# Patient Record
Sex: Female | Born: 1990 | Race: Black or African American | Hispanic: No | Marital: Married | Smoking: Never smoker
Health system: Southern US, Community
[De-identification: ages and names within clinical notes are randomized; demographics above are authoritative.]

## PROBLEM LIST (undated history)

## (undated) DIAGNOSIS — Z789 Other specified health status: Secondary | ICD-10-CM

## (undated) DIAGNOSIS — O09299 Supervision of pregnancy with other poor reproductive or obstetric history, unspecified trimester: Secondary | ICD-10-CM

## (undated) HISTORY — PX: NO PAST SURGERIES: SHX2092

## (undated) HISTORY — DX: Other specified health status: Z78.9

## (undated) HISTORY — DX: Supervision of pregnancy with other poor reproductive or obstetric history, unspecified trimester: O09.299

---

## 2012-09-19 ENCOUNTER — Other Ambulatory Visit: Payer: Self-pay | Admitting: Infectious Diseases

## 2012-09-19 ENCOUNTER — Ambulatory Visit
Admission: RE | Admit: 2012-09-19 | Discharge: 2012-09-19 | Disposition: A | Discharge status: Home / Self Care | Payer: No Typology Code available for payment source | Source: Ambulatory Visit | Attending: Infectious Diseases | Admitting: Infectious Diseases

## 2012-09-19 DIAGNOSIS — R7612 Nonspecific reaction to cell mediated immunity measurement of gamma interferon antigen response without active tuberculosis: Secondary | ICD-10-CM

## 2016-12-20 NOTE — L&D Delivery Note (Signed)
Patient is a 26 y.o. now G2P1011 s/p VAVD at 4329w3d, who was admitted on 12/02/17 for IOL for pre-eclampsia. S/p  IOL with cytotec, FB, and IV Pitocin. SROM at 06:56 am today. Had fever of 100.58F while pushing, but repeat temps < 100.60F and patient did not receive IV antibiotics. GBS neg. First stage of labor complete at 13:37.  Delivery Note At 4:57 PM a viable female was delivered via Vaginal, Vacuum (Extractor) (Presentation: vertex; LOA).  APGAR: 5, 9; weight 9lb 13.1oz Placenta status: intact.  Cord:  3-vessel  Cord pH: 7.35  Anesthesia:  Epidural Episiotomy: None Lacerations: 2nd degree;Perineal Suture Repair: 3.0 vicryl Est. Blood Loss (mL):  500  Patient complete and pushing with good maternal effort for almost 3 hours. Fetal station +3. Patient verbally consented for vacuum-assisted delivery.  The soft vacuum soft cup was positioned over the sagittal suture 3 cm anterior to posterior fontanelle.  Pressure was then increased to 500 mmHg, and the patient was instructed to push.  Pulling was administered along the pelvic curve.  4 pulls were administered during over two contractions, no popoffs.  The infant was then delivered atraumatically, noted to be a viable female infant, cord was clamped and cut and infant taken to the warmer. Cord pH obtained. Placenta delivered spontaneously with gentle cord traction. Fundus firm with massage and Pitocin. Perineum inspected and found to have a deep 2nd degree laceration, which was repaired with 3.0 Vycril with good hemostasis achieved.  Mom to postpartum.  Baby to Couplet care / Skin to Skin.   Raynelle FanningJulie P. Joshuajames Moehring, MD OB Fellow 12/03/17, 5:48 PM

## 2016-12-20 NOTE — L&D Delivery Note (Signed)
Infant heart rate after delivery 100 bpm and decreasing.  Infant taken to warmer and 30 seconds of PPV given.  Infant began to have spontaneous respiration and movement.  NICU arrived appoximately 1.5 minutes after delivery

## 2017-01-01 ENCOUNTER — Emergency Department (HOSPITAL_COMMUNITY)
Admission: EM | Admit: 2017-01-01 | Discharge: 2017-01-01 | Disposition: A | Discharge status: Home / Self Care | Payer: BLUE CROSS/BLUE SHIELD | Attending: Emergency Medicine | Admitting: Emergency Medicine

## 2017-01-01 ENCOUNTER — Encounter (HOSPITAL_COMMUNITY): Payer: Self-pay | Admitting: Emergency Medicine

## 2017-01-01 DIAGNOSIS — N939 Abnormal uterine and vaginal bleeding, unspecified: Secondary | ICD-10-CM

## 2017-01-01 DIAGNOSIS — Z3A08 8 weeks gestation of pregnancy: Secondary | ICD-10-CM | POA: Insufficient documentation

## 2017-01-01 DIAGNOSIS — O0281 Inappropriate change in quantitative human chorionic gonadotropin (hCG) in early pregnancy: Secondary | ICD-10-CM | POA: Diagnosis not present

## 2017-01-01 DIAGNOSIS — Z3491 Encounter for supervision of normal pregnancy, unspecified, first trimester: Secondary | ICD-10-CM

## 2017-01-01 DIAGNOSIS — O034 Incomplete spontaneous abortion without complication: Secondary | ICD-10-CM

## 2017-01-01 DIAGNOSIS — O039 Complete or unspecified spontaneous abortion without complication: Secondary | ICD-10-CM | POA: Diagnosis not present

## 2017-01-01 DIAGNOSIS — O209 Hemorrhage in early pregnancy, unspecified: Secondary | ICD-10-CM | POA: Diagnosis present

## 2017-01-01 DIAGNOSIS — R109 Unspecified abdominal pain: Secondary | ICD-10-CM

## 2017-01-01 LAB — CBC WITH DIFFERENTIAL/PLATELET
BASOS PCT: 0 %
Basophils Absolute: 0 10*3/uL (ref 0.0–0.1)
EOS ABS: 0 10*3/uL (ref 0.0–0.7)
Eosinophils Relative: 0 %
HCT: 40.1 % (ref 36.0–46.0)
HEMOGLOBIN: 13.4 g/dL (ref 12.0–15.0)
LYMPHS ABS: 2.5 10*3/uL (ref 0.7–4.0)
Lymphocytes Relative: 23 %
MCH: 29.8 pg (ref 26.0–34.0)
MCHC: 33.4 g/dL (ref 30.0–36.0)
MCV: 89.1 fL (ref 78.0–100.0)
Monocytes Absolute: 0.6 10*3/uL (ref 0.1–1.0)
Monocytes Relative: 6 %
NEUTROS PCT: 71 %
Neutro Abs: 7.7 10*3/uL (ref 1.7–7.7)
Platelets: 225 10*3/uL (ref 150–400)
RBC: 4.5 MIL/uL (ref 3.87–5.11)
RDW: 14 % (ref 11.5–15.5)
WBC: 10.8 10*3/uL — ABNORMAL HIGH (ref 4.0–10.5)

## 2017-01-01 LAB — ABO/RH: ABO/RH(D): A POS

## 2017-01-01 LAB — HCG, QUANTITATIVE, PREGNANCY: HCG, BETA CHAIN, QUANT, S: 5664 m[IU]/mL — AB (ref <5)

## 2017-01-01 NOTE — ED Triage Notes (Signed)
Patient with vaginal bleeding that started on Thursday.  Patient states she is [redacted] weeks pregnant and the bleeding got heavy today, with clots.  She is having abdominal cramping.

## 2017-01-01 NOTE — ED Provider Notes (Signed)
[facility]-EMERGENCY DEPT Provider Note  CSN: [id number] Arrival date & time: 01/01/17  1903  History   Chief Complaint Chief Complaint  Patient presents with  . Vaginal Bleeding   HPI Kathleen Cain is a 26 y.o. female.  The history is provided by the patient. No language interpreter was used.  Illness  This is a new problem. The current episode started more than 2 days ago. The problem occurs constantly. The problem has been gradually worsening. Pertinent negatives include no chest pain, no abdominal pain, no headaches and no shortness of breath. Nothing aggravates the symptoms. Nothing relieves the symptoms. She has tried nothing for the symptoms.    History reviewed. No pertinent past medical history.  There are no active problems to display for this patient.  History reviewed. No pertinent surgical history.  OB History    Gravida Para Term Preterm AB Living   1             SAB TAB Ectopic Multiple Live Births                  Home Medications    Prior to Admission medications   Medication Sig Start Date End Date Taking? Authorizing Provider  Prenatal Vit-Fe Fumarate-FA (PRENATAL MULTIVITAMIN) TABS tablet Take 1 tablet by mouth daily at 12 noon.   Yes Historical Provider, MD   Family History No family history on file.  Social History Social History  Substance Use Topics  . Smoking status: Never Smoker  . Smokeless tobacco: Never Used  . Alcohol use No    Allergies   Patient has no known allergies.   Review of Systems Review of Systems  Constitutional: Negative for chills and fever.  Respiratory: Negative for shortness of breath.   Cardiovascular: Negative for chest pain.  Gastrointestinal: Negative for abdominal pain.  Genitourinary: Positive for vaginal bleeding. Negative for dysuria, frequency and vaginal discharge.  Neurological: Negative for headaches.  All other systems reviewed and are negative.   Physical Exam Updated Vital Signs BP 113/81 (BP  Location: Right Arm)   Pulse 85   Temp 98.6 F (37 C) (Oral)   Resp 22   LMP 10/23/2016 (Exact Date)   SpO2 99%   Physical Exam  Constitutional: She is oriented to person, place, and time. No distress.  Young AA female  HENT:  Head: Normocephalic and atraumatic.  Eyes: EOM are normal. Pupils are equal, round, and reactive to light.  Neck: Normal range of motion. Neck supple.  Cardiovascular: Normal rate, regular rhythm and normal heart sounds.   Pulmonary/Chest: Effort normal and breath sounds normal.  Abdominal: Soft. Bowel sounds are normal. She exhibits no distension. There is tenderness (suprapubic). There is no rebound and no guarding.  Genitourinary:  Genitourinary Comments: Pelvic exam showing dark blood & clots in the vaginal vault, no BRB, os slightly open with dark blood & clots extruding, no CMT or cervical friability  Musculoskeletal: Normal range of motion.  Neurological: She is alert and oriented to person, place, and time.  Skin: Skin is warm and dry. Capillary refill takes less than 2 seconds. She is not diaphoretic.  Nursing note and vitals reviewed.   ED Treatments / Results  Labs (all labs ordered are listed, but only abnormal results are displayed) Labs Reviewed  CBC WITH DIFFERENTIAL/PLATELET - Abnormal; Notable for the following:       Result Value   WBC 10.8 (*)    All other components within normal limits  HCG, QUANTITATIVE, PREGNANCY -  Abnormal; Notable for the following:    hCG, Beta Chain, Quant, S 5,664 (*)    All other components within normal limits  ABO/RH   EKG  EKG Interpretation None      Radiology No results found.  Procedures Procedures (including critical care time)  Medications Ordered in ED Medications - No data to display  Initial Impression / Assessment and Plan / ED Course  I have reviewed the triage vital signs and the nursing notes.  Clinical Course   26 y.o. female with above stated PMHx, HPI, and physical. LMP x2  months ago. Seen at OSH x1 week ago with US estimated 8 weeks pregnancy w/o heartbeat. Vaginal spotting starting Thursday (x2 days ago). Now with heavier bleeding with passage of clots. Patient denies fever, chills, nausea, vomiting, diarrhea, urinary symptoms, shortness of breath, lightheadedness, fatigue.  ABO with A(+) blood type - no need for Rhogam. Beta hcg 5,664 (low for expected level of 8-9 weeks of pregnancy). CBC with Hgb of 13. Bedside transabdominal US showing no evidence of yolk sac or fetal pole no heartbeat. History & exam consistent with inevitable spontaneous miscarriage. [facility]  Laboratory results were personally reviewed by myself and used in the medical decision making of this patient's treatment and disposition.  Pt discharged home in stable condition. Strict ED return precautions dicussed including but not limited to fever, chills, worsening abdominal pain, SOB, or lightheadedness/syncope. Pt understands and agrees with the plan and has no further questions or concerns.   Pt care discussed with and followed by my attending, Dr. Asencion PartridgeJoshua Long  Garlen Reinig, MD Pager [phone]   Final Clinical Impressions(s) / ED Diagnoses   Final diagnoses:  First trimester pregnancy  Vaginal bleeding  Abdominal cramping  Inevitable spontaneous abortion    New Prescriptions Discharge Medication List as of 01/01/2017  9:24 PM       Angelina Okyan Regan Mcbryar, MD 01/01/17 [id number]    Maia PlanJoshua G Long, MD 01/02/17 1156

## 2017-01-01 NOTE — Discharge Instructions (Signed)
Please return for shortness of breath, lightheadedness, fever, or chills

## 2017-01-17 ENCOUNTER — Ambulatory Visit (INDEPENDENT_AMBULATORY_CARE_PROVIDER_SITE_OTHER): Payer: BLUE CROSS/BLUE SHIELD | Admitting: Obstetrics and Gynecology

## 2017-01-17 ENCOUNTER — Encounter: Payer: Self-pay | Admitting: Obstetrics and Gynecology

## 2017-01-17 VITALS — BP 103/69 | HR 82 | Ht 64.0 in | Wt 135.5 lb

## 2017-01-17 DIAGNOSIS — O039 Complete or unspecified spontaneous abortion without complication: Secondary | ICD-10-CM

## 2017-01-17 NOTE — Progress Notes (Signed)
   Subjective:    Patient ID: Kathleen Cain, female    DOB: 11-10-1991, 26 y.o.   MRN: [id number]  HPI Kathleen PionLaynaddis Sickles is a 26 y.o. G1P0 with LMP 10/23/16 here for follow up of probable SAB. [facility] and brings US report: no YS, fetal pole or FH. Also brings photo of scan showing questionable early IUP. She was advised to go to ER but did not go until 01/01/17 after 2 days of heavy bleeding with clots and cramping. Bedside US at that time showed no IUP and quantitative bHCG was 5664. After that she had 2 more days of bleeding with light vaginal spotting intermittently since then. No cramping.  Pregnancy was planned. Blood type A pos.     Review of Systems  Constitutional: Negative for fever.  Gastrointestinal: Negative for abdominal pain, diarrhea, nausea and vomiting.  Genitourinary: Positive for vaginal bleeding. Negative for dyspareunia, dysuria, flank pain, vaginal discharge and vaginal pain.       Objective:   Physical Exam  Constitutional: She is oriented to person, place, and time. She appears well-developed and well-nourished. No distress.  HENT:  Head: Normocephalic.  Eyes: Pupils are equal, round, and reactive to light.  Neck: Normal range of motion.  Cardiovascular: Normal rate.   Pulmonary/Chest: Effort normal.  Abdominal: Soft. There is tenderness.  Genitourinary: Vagina normal and uterus normal. Rectal exam shows guaiac negative stool.  Genitourinary Comments: Bimanual: cx thick, closed, no CMT, Uterus AV,  NSSP, no adnexal tenderness or masses  Musculoskeletal: Normal range of motion.  Neurological: She is alert and oriented to person, place, and time.  Skin: Skin is warm and dry.  Nursing note and vitals reviewed.         Assessment & Plan:  S/P SAB  Since no definite IUP documented, quantitative beta hCG sent. Will call tomorrow with result Advised barrier method contraception until normal menstrual cycle and PNV 1/d when  attempting pregnancy

## 2017-01-17 NOTE — Progress Notes (Signed)
Since Er Visit 01/01/17 had bright red bleeding for about 3 days. Since that occaionally has had dark spotting .

## 2017-01-18 ENCOUNTER — Telehealth: Payer: Self-pay | Admitting: Obstetrics and Gynecology

## 2017-01-18 DIAGNOSIS — O039 Complete or unspecified spontaneous abortion without complication: Secondary | ICD-10-CM | POA: Insufficient documentation

## 2017-01-18 LAB — HCG, QUANTITATIVE, PREGNANCY: hCG, Beta Chain, Quant, S: 10.2 m[IU]/mL — ABNORMAL HIGH

## 2017-01-18 NOTE — Telephone Encounter (Signed)
TC to pt: BHcg 10.2 Left message needs another F/U quant in 1 wk.

## 2017-01-24 ENCOUNTER — Other Ambulatory Visit: Payer: BLUE CROSS/BLUE SHIELD

## 2017-01-24 DIAGNOSIS — O039 Complete or unspecified spontaneous abortion without complication: Secondary | ICD-10-CM

## 2017-01-24 LAB — HCG, QUANTITATIVE, PREGNANCY: hCG, Beta Chain, Quant, S: 2 m[IU]/mL

## 2017-02-02 ENCOUNTER — Telehealth: Payer: Self-pay | Admitting: *Deleted

## 2017-02-02 NOTE — Telephone Encounter (Signed)
Pt left message requesting lab results from 2 weeks ago.

## 2017-02-09 NOTE — Telephone Encounter (Signed)
Mychart message to patient with results.  

## 2017-07-04 LAB — OB RESULTS CONSOLE ABO/RH: RH TYPE: POSITIVE

## 2017-07-04 LAB — OB RESULTS CONSOLE GC/CHLAMYDIA
CHLAMYDIA, DNA PROBE: NEGATIVE
GC PROBE AMP, GENITAL: NEGATIVE

## 2017-07-04 LAB — OB RESULTS CONSOLE RPR: RPR: NONREACTIVE

## 2017-07-04 LAB — OB RESULTS CONSOLE HIV ANTIBODY (ROUTINE TESTING): HIV: NONREACTIVE

## 2017-07-04 LAB — OB RESULTS CONSOLE HEPATITIS B SURFACE ANTIGEN: Hepatitis B Surface Ag: NEGATIVE

## 2017-07-04 LAB — OB RESULTS CONSOLE RUBELLA ANTIBODY, IGM: Rubella: IMMUNE

## 2017-07-04 LAB — OB RESULTS CONSOLE ANTIBODY SCREEN: ANTIBODY SCREEN: NEGATIVE

## 2017-11-01 LAB — OB RESULTS CONSOLE GC/CHLAMYDIA
CHLAMYDIA, DNA PROBE: NEGATIVE
Gonorrhea: NEGATIVE

## 2017-11-01 LAB — OB RESULTS CONSOLE GBS: STREP GROUP B AG: NEGATIVE

## 2017-11-30 ENCOUNTER — Encounter (HOSPITAL_COMMUNITY): Payer: Self-pay | Admitting: *Deleted

## 2017-11-30 ENCOUNTER — Telehealth (HOSPITAL_COMMUNITY): Payer: Self-pay | Admitting: *Deleted

## 2017-11-30 ENCOUNTER — Other Ambulatory Visit: Payer: Self-pay | Admitting: Obstetrics and Gynecology

## 2017-12-01 ENCOUNTER — Encounter (HOSPITAL_COMMUNITY): Payer: Self-pay | Admitting: *Deleted

## 2017-12-01 NOTE — Telephone Encounter (Signed)
Preadmission screen  

## 2017-12-02 ENCOUNTER — Encounter (HOSPITAL_COMMUNITY): Payer: Self-pay | Admitting: *Deleted

## 2017-12-02 ENCOUNTER — Inpatient Hospital Stay (HOSPITAL_COMMUNITY)
Admission: AD | Admit: 2017-12-02 | Discharge: 2017-12-05 | DRG: 807 | Disposition: A | Discharge status: Home / Self Care | Payer: BLUE CROSS/BLUE SHIELD | Source: Ambulatory Visit | Attending: Obstetrics and Gynecology | Admitting: Obstetrics and Gynecology

## 2017-12-02 DIAGNOSIS — Z3A40 40 weeks gestation of pregnancy: Secondary | ICD-10-CM | POA: Diagnosis not present

## 2017-12-02 DIAGNOSIS — O1494 Unspecified pre-eclampsia, complicating childbirth: Secondary | ICD-10-CM | POA: Diagnosis present

## 2017-12-02 DIAGNOSIS — O480 Post-term pregnancy: Secondary | ICD-10-CM | POA: Diagnosis present

## 2017-12-02 DIAGNOSIS — Z8759 Personal history of other complications of pregnancy, childbirth and the puerperium: Secondary | ICD-10-CM

## 2017-12-02 LAB — ABO/RH: ABO/RH(D): A POS

## 2017-12-02 LAB — CBC
HEMATOCRIT: 43.5 % (ref 36.0–46.0)
Hemoglobin: 14.4 g/dL (ref 12.0–15.0)
MCH: 32.2 pg (ref 26.0–34.0)
MCHC: 33.1 g/dL (ref 30.0–36.0)
MCV: 97.3 fL (ref 78.0–100.0)
Platelets: 166 10*3/uL (ref 150–400)
RBC: 4.47 MIL/uL (ref 3.87–5.11)
RDW: 14.3 % (ref 11.5–15.5)
WBC: 10.5 10*3/uL (ref 4.0–10.5)

## 2017-12-02 LAB — COMPREHENSIVE METABOLIC PANEL
ALT: 13 U/L — ABNORMAL LOW (ref 14–54)
AST: 29 U/L (ref 15–41)
Albumin: 3.3 g/dL — ABNORMAL LOW (ref 3.5–5.0)
Alkaline Phosphatase: 324 U/L — ABNORMAL HIGH (ref 38–126)
Anion gap: 12 (ref 5–15)
BUN: 7 mg/dL (ref 6–20)
CHLORIDE: 102 mmol/L (ref 101–111)
CO2: 19 mmol/L — ABNORMAL LOW (ref 22–32)
Calcium: 9.2 mg/dL (ref 8.9–10.3)
Creatinine, Ser: 0.41 mg/dL — ABNORMAL LOW (ref 0.44–1.00)
Glucose, Bld: 104 mg/dL — ABNORMAL HIGH (ref 65–99)
POTASSIUM: 4.1 mmol/L (ref 3.5–5.1)
Sodium: 133 mmol/L — ABNORMAL LOW (ref 135–145)
Total Bilirubin: 0.5 mg/dL (ref 0.3–1.2)
Total Protein: 7.2 g/dL (ref 6.5–8.1)

## 2017-12-02 LAB — TYPE AND SCREEN
ABO/RH(D): A POS
ANTIBODY SCREEN: NEGATIVE

## 2017-12-02 MED ORDER — FENTANYL CITRATE (PF) 100 MCG/2ML IJ SOLN: 100.0000 ug | INTRAMUSCULAR | Status: DC | PRN

## 2017-12-02 MED ORDER — OXYTOCIN BOLUS FROM INFUSION
500.0000 mL | Freq: Once | INTRAVENOUS | Status: AC
  Administered 2017-12-03: 500 mL via INTRAVENOUS

## 2017-12-02 MED ORDER — TERBUTALINE SULFATE 1 MG/ML IJ SOLN
0.2500 mg | Freq: Once | INTRAMUSCULAR | Status: DC | PRN
  Filled 2017-12-02: qty 1

## 2017-12-02 MED ORDER — ONDANSETRON HCL 4 MG/2ML IJ SOLN: 4.0000 mg | Freq: Four times a day (QID) | INTRAMUSCULAR | Status: DC | PRN

## 2017-12-02 MED ORDER — OXYCODONE-ACETAMINOPHEN 5-325 MG PO TABS: 2.0000 | ORAL_TABLET | ORAL | Status: DC | PRN

## 2017-12-02 MED ORDER — LIDOCAINE HCL (PF) 1 % IJ SOLN
30.0000 mL | INTRAMUSCULAR | Status: AC | PRN
  Administered 2017-12-03: 30 mL via SUBCUTANEOUS
  Filled 2017-12-02: qty 30

## 2017-12-02 MED ORDER — MISOPROSTOL 50MCG HALF TABLET
50.0000 ug | ORAL_TABLET | ORAL | Status: DC | PRN
  Administered 2017-12-02 – 2017-12-03 (×3): 50 ug via ORAL
  Filled 2017-12-02 (×4): qty 1

## 2017-12-02 MED ORDER — LACTATED RINGERS IV SOLN
INTRAVENOUS | Status: DC
  Administered 2017-12-02 – 2017-12-03 (×2): via INTRAVENOUS

## 2017-12-02 MED ORDER — OXYCODONE-ACETAMINOPHEN 5-325 MG PO TABS: 1.0000 | ORAL_TABLET | ORAL | Status: DC | PRN

## 2017-12-02 MED ORDER — LACTATED RINGERS IV SOLN
500.0000 mL | INTRAVENOUS | Status: DC | PRN
  Administered 2017-12-03: 500 mL via INTRAVENOUS

## 2017-12-02 MED ORDER — OXYTOCIN 40 UNITS IN LACTATED RINGERS INFUSION - SIMPLE MED
2.5000 [IU]/h | INTRAVENOUS | Status: DC
  Administered 2017-12-03: 2.5 [IU]/h via INTRAVENOUS

## 2017-12-02 MED ORDER — SOD CITRATE-CITRIC ACID 500-334 MG/5ML PO SOLN: 30.0000 mL | ORAL | Status: DC | PRN

## 2017-12-02 MED ORDER — ACETAMINOPHEN 325 MG PO TABS: 650.0000 mg | ORAL_TABLET | ORAL | Status: DC | PRN

## 2017-12-02 MED ORDER — FLEET ENEMA 7-19 GM/118ML RE ENEM: 1.0000 | ENEMA | RECTAL | Status: DC | PRN

## 2017-12-02 NOTE — Anesthesia Pain Management Evaluation Note (Signed)
  [facility] Pain Management Visit Note  Patient: Kathleen Cain, 26 y.o., female  "Hello I am a [facility] of the anesthesia team at [facility]. We have an anesthesia team available at all times to provide care throughout the hospital, including epidural management and anesthesia for C-section. I don't know your plan for the delivery whether it a natural birth, water birth, IV sedation, nitrous supplementation, doula or epidural, but we want to meet your pain goals."   1.Was your pain managed to your expectations on prior hospitalizations?   No prior hospitalizations  2.What is your expectation for pain management during this hospitalization?     Labor support without medications  3.How can we help you reach that goal? natural  Record the patient's initial score and the patient's pain goal.   Pain: 0  Pain Goal: 10 [facility] wants you to be able to say your pain was always managed very well.  Celine Dishman 12/02/2017

## 2017-12-02 NOTE — H&P (Signed)
LABOR AND DELIVERY ADMISSION HISTORY AND PHYSICAL NOTE  Kathleen Cain is a 26 y.o. female G2P0010 with IUP at 4785w2d by LMP presenting for IOL for pre-e (proteinuria) w/o severe features.  She reports positive fetal movement. She denies leakage of fluid or vaginal bleeding.  Prenatal History/Complications: PNC at HD Pregnancy complications:  - Pre-e  Past Medical History: Past Medical History:  Diagnosis Date  . Medical history non-contributory     Past Surgical History: Past Surgical History:  Procedure Laterality Date  . NO PAST SURGERIES      Obstetrical History: OB History    Gravida Para Term Preterm AB Living   2       1     SAB TAB Ectopic Multiple Live Births   1              Social History: Social History   Socioeconomic History  . Marital status: Single    Spouse name: None  . Number of children: None  . Years of education: None  . Highest education level: None  Social Needs  . Financial resource strain: None  . Food insecurity - worry: None  . Food insecurity - inability: None  . Transportation needs - medical: None  . Transportation needs - non-medical: None  Occupational History  . None  Tobacco Use  . Smoking status: Never Smoker  . Smokeless tobacco: Never Used  Substance and Sexual Activity  . Alcohol use: No  . Drug use: No  . Sexual activity: Yes    Birth control/protection: None  Other Topics Concern  . None  Social History Narrative  . None    Family History: Family History  Problem Relation Age of Onset  . Cervical cancer Mother     Allergies: No Known Allergies  Medications Prior to Admission  Medication Sig Dispense Refill Last Dose  . Prenatal Vit-Fe Fumarate-FA (PRENATAL MULTIVITAMIN) TABS tablet Take 1 tablet by mouth daily at 12 noon.   Not Taking     Review of Systems  All systems reviewed and negative except as stated in HPI  Physical Exam unknown if currently breastfeeding. General appearance: alert,  cooperative, appears stated age and no distress Lungs: no respiratory distress Heart: regular rate  Abdomen: soft, non-tender; bowel sounds normal Extremities: No calf swelling or tenderness Presentation: cephalic per clinic exam Fetal monitoring: external Uterine activity: toco   Prenatal labs: ABO, Rh: A/Positive/-- (07/16 0000) Antibody: Negative (07/16 0000) Rubella: Immune (07/16 0000) RPR: Nonreactive (07/16 0000)  HBsAg: Negative (07/16 0000)  HIV: Non-reactive (07/16 0000)  GC/Chlamydia: neg GBS: Negative (11/13 0000)  1 hr Glucola: normal Genetic screening:  Quad neg Anatomy US: normal  Prenatal Transfer Tool  Maternal Diabetes: No Genetic Screening: Normal Maternal Ultrasounds/Referrals: Normal Fetal Ultrasounds or other Referrals:  None Maternal Substance Abuse:  No Significant Maternal Medications:  None Significant Maternal Lab Results: None  No results found for this or any previous visit (from the past 24 hour(s)).  Patient Active Problem List   Diagnosis Date Noted  . Post-dates pregnancy 12/02/2017  . SAB (spontaneous abortion) 01/18/2017    Assessment: Kathleen Cain is a 26 y.o. G2P0010 at 5285w2d here for IOL 2/2 to pre-e (proteinuria) w/o severe features  #Labor: IOL planned cytotec #Pain: Per maternal request, does not currently want epidural #FWB: Cat 1 #ID:  gbs neg #MOF: breast #MOC:pills #Circ:  Yes, will provide list of local options  Marthenia RollingScott Bland 12/02/2017, 4:32 PM  I confirm that I have verified the  information documented in the resident's note and that I have also personally reperformed the physical exam and all medical decision making activities. The patient was seen and examined by me also Agree with note NST reactive and reassuring UCs as listed Cervical exams as listed in note  Aviva SignsWilliams, Reiner Loewen L, CNM

## 2017-12-02 NOTE — Progress Notes (Signed)
Labor Progress Note  Kathleen Cain is a 26 y.o. G2P0010 at 5857w2d  admitted for induction of labor due to Pre-eclamptic toxemia of pregnancy.  S: Doing well. Currently without pain. Wants to go natural in birthing process. Denies PIH symptoms (HA, scotomata, RUQ pain)  O:  BP 124/72   Pulse 95   Temp 98 F (36.7 C) (Oral)   Resp 16   Ht 5\' 4"  (1.626 m)   Wt 87.5 kg (193 lb)   BMI 33.13 kg/m   No intake/output data recorded.  FHT:  FHR: 150 bpm, variability: moderate,  accelerations:  Present,  decelerations:  Absent UC:   regular, every 4 minutes SVE:   Dilation: 1 Effacement (%): 50 Station: Ballotable Exam by:: Ferne CoeS Earl RNC Membranes intact  Labs: Lab Results  Component Value Date   WBC 10.5 12/02/2017   HGB 14.4 12/02/2017   HCT 43.5 12/02/2017   MCV 97.3 12/02/2017   PLT 166 12/02/2017    Assessment / Plan: 26 y.o. G2P0010 7657w2d. Not in labor Induction of labor due to preeclampsia  Labor: Cotninue induction with cytotec, FB placed Fetal Wellbeing:  Category I Pain Control:  Labor support without medications Anticipated MOD:  NSVD  Expectant management  Caryl AdaJazma Emmelia Holdsworth, DO OB Fellow

## 2017-12-03 ENCOUNTER — Inpatient Hospital Stay (HOSPITAL_COMMUNITY): Payer: BLUE CROSS/BLUE SHIELD | Admitting: Anesthesiology

## 2017-12-03 ENCOUNTER — Encounter (HOSPITAL_COMMUNITY): Payer: Self-pay | Admitting: *Deleted

## 2017-12-03 DIAGNOSIS — O1494 Unspecified pre-eclampsia, complicating childbirth: Secondary | ICD-10-CM

## 2017-12-03 DIAGNOSIS — Z3A40 40 weeks gestation of pregnancy: Secondary | ICD-10-CM

## 2017-12-03 LAB — PROTEIN / CREATININE RATIO, URINE: Creatinine, Urine: 32 mg/dL

## 2017-12-03 LAB — CBC
HCT: 38.9 % (ref 36.0–46.0)
HEMATOCRIT: 39.8 % (ref 36.0–46.0)
Hemoglobin: 13.1 g/dL (ref 12.0–15.0)
Hemoglobin: 13 g/dL (ref 12.0–15.0)
MCH: 32 pg (ref 26.0–34.0)
MCH: 32.3 pg (ref 26.0–34.0)
MCHC: 32.9 g/dL (ref 30.0–36.0)
MCHC: 33.4 g/dL (ref 30.0–36.0)
MCV: 97.1 fL (ref 78.0–100.0)
MCV: 96.5 fL (ref 78.0–100.0)
PLATELETS: 128 10*3/uL — AB (ref 150–400)
PLATELETS: 141 10*3/uL — AB (ref 150–400)
RBC: 4.1 MIL/uL (ref 3.87–5.11)
RBC: 4.03 MIL/uL (ref 3.87–5.11)
RDW: 14.3 % (ref 11.5–15.5)
RDW: 14.1 % (ref 11.5–15.5)
WBC: 12 10*3/uL — AB (ref 4.0–10.5)
WBC: 19.3 10*3/uL — AB (ref 4.0–10.5)

## 2017-12-03 LAB — RPR: RPR Ser Ql: NONREACTIVE

## 2017-12-03 MED ORDER — EPHEDRINE 5 MG/ML INJ
10.0000 mg | INTRAVENOUS | Status: DC | PRN
  Filled 2017-12-03: qty 2

## 2017-12-03 MED ORDER — DIBUCAINE 1 % RE OINT: 1.0000 "application " | TOPICAL_OINTMENT | RECTAL | Status: DC | PRN

## 2017-12-03 MED ORDER — BENZOCAINE-MENTHOL 20-0.5 % EX AERO
1.0000 "application " | INHALATION_SPRAY | CUTANEOUS | Status: DC | PRN
  Administered 2017-12-04: 1 via TOPICAL
  Filled 2017-12-03: qty 56

## 2017-12-03 MED ORDER — ONDANSETRON HCL 4 MG/2ML IJ SOLN: 4.0000 mg | INTRAMUSCULAR | Status: DC | PRN

## 2017-12-03 MED ORDER — PHENYLEPHRINE 40 MCG/ML (10ML) SYRINGE FOR IV PUSH (FOR BLOOD PRESSURE SUPPORT)
80.0000 ug | PREFILLED_SYRINGE | INTRAVENOUS | Status: DC | PRN
  Filled 2017-12-03: qty 10
  Filled 2017-12-03: qty 5

## 2017-12-03 MED ORDER — LACTATED RINGERS IV SOLN
500.0000 mL | Freq: Once | INTRAVENOUS | Status: AC
  Administered 2017-12-03: 500 mL via INTRAVENOUS

## 2017-12-03 MED ORDER — COCONUT OIL OIL
1.0000 "application " | TOPICAL_OIL | Status: DC | PRN
  Administered 2017-12-05: 1 via TOPICAL
  Filled 2017-12-03: qty 120

## 2017-12-03 MED ORDER — SENNOSIDES-DOCUSATE SODIUM 8.6-50 MG PO TABS
2.0000 | ORAL_TABLET | ORAL | Status: DC
  Administered 2017-12-04 (×2): 2 via ORAL
  Filled 2017-12-03 (×2): qty 2

## 2017-12-03 MED ORDER — ACETAMINOPHEN 325 MG PO TABS: 650.0000 mg | ORAL_TABLET | ORAL | Status: DC | PRN

## 2017-12-03 MED ORDER — IBUPROFEN 600 MG PO TABS
600.0000 mg | ORAL_TABLET | Freq: Four times a day (QID) | ORAL | Status: DC
  Administered 2017-12-04 – 2017-12-05 (×7): 600 mg via ORAL
  Filled 2017-12-03 (×7): qty 1

## 2017-12-03 MED ORDER — TETANUS-DIPHTH-ACELL PERTUSSIS 5-2.5-18.5 LF-MCG/0.5 IM SUSP: 0.5000 mL | Freq: Once | INTRAMUSCULAR | Status: DC

## 2017-12-03 MED ORDER — SIMETHICONE 80 MG PO CHEW: 80.0000 mg | CHEWABLE_TABLET | ORAL | Status: DC | PRN

## 2017-12-03 MED ORDER — LIDOCAINE HCL (PF) 1 % IJ SOLN
INTRAMUSCULAR | Status: DC | PRN
  Administered 2017-12-03: 13 mL via EPIDURAL

## 2017-12-03 MED ORDER — FENTANYL 2.5 MCG/ML BUPIVACAINE 1/10 % EPIDURAL INFUSION (WH - ANES)
14.0000 mL/h | INTRAMUSCULAR | Status: DC | PRN
  Administered 2017-12-03: 14 mL/h via EPIDURAL
  Filled 2017-12-03: qty 100

## 2017-12-03 MED ORDER — DIPHENHYDRAMINE HCL 50 MG/ML IJ SOLN: 12.5000 mg | INTRAMUSCULAR | Status: DC | PRN

## 2017-12-03 MED ORDER — DIPHENHYDRAMINE HCL 25 MG PO CAPS: 25.0000 mg | ORAL_CAPSULE | Freq: Four times a day (QID) | ORAL | Status: DC | PRN

## 2017-12-03 MED ORDER — WITCH HAZEL-GLYCERIN EX PADS: 1.0000 "application " | MEDICATED_PAD | CUTANEOUS | Status: DC | PRN

## 2017-12-03 MED ORDER — TERBUTALINE SULFATE 1 MG/ML IJ SOLN
0.2500 mg | Freq: Once | INTRAMUSCULAR | Status: DC | PRN
  Filled 2017-12-03: qty 1

## 2017-12-03 MED ORDER — ZOLPIDEM TARTRATE 5 MG PO TABS: 5.0000 mg | ORAL_TABLET | Freq: Every evening | ORAL | Status: DC | PRN

## 2017-12-03 MED ORDER — PRENATAL MULTIVITAMIN CH
1.0000 | ORAL_TABLET | Freq: Every day | ORAL | Status: DC
  Administered 2017-12-05: 1 via ORAL
  Filled 2017-12-03 (×2): qty 1

## 2017-12-03 MED ORDER — PHENYLEPHRINE 40 MCG/ML (10ML) SYRINGE FOR IV PUSH (FOR BLOOD PRESSURE SUPPORT)
80.0000 ug | PREFILLED_SYRINGE | INTRAVENOUS | Status: DC | PRN
Start: 2017-12-03 — End: 2017-12-03
  Filled 2017-12-03: qty 5

## 2017-12-03 MED ORDER — OXYTOCIN 40 UNITS IN LACTATED RINGERS INFUSION - SIMPLE MED
1.0000 m[IU]/min | INTRAVENOUS | Status: DC
  Administered 2017-12-03: 4 m[IU]/min via INTRAVENOUS
  Administered 2017-12-03: 2 m[IU]/min via INTRAVENOUS
  Filled 2017-12-03: qty 1000

## 2017-12-03 MED ORDER — ONDANSETRON HCL 4 MG PO TABS: 4.0000 mg | ORAL_TABLET | ORAL | Status: DC | PRN

## 2017-12-03 NOTE — Progress Notes (Signed)
POSTPARTUM PROGRESS NOTE  Post Partum Day 1 Subjective:  Kathleen Cain is a 26 y.o. G2P1011 4266w3d s/p VAVD.  No acute events overnight.  Pt denies problems with ambulating, voiding or po intake.  She denies nausea or vomiting.  Pain is well controlled. Lochia Minimal.   Objective: Blood pressure (!) 105/54, pulse 97, temperature 98.4 F (36.9 C), temperature source Oral, resp. rate 18, height 5\' 4"  (1.626 m), weight 180 lb 1.6 oz (81.7 kg), SpO2 100 %, unknown if currently breastfeeding.  Physical Exam:  General: alert, cooperative and no distress Lochia:normal flow Chest: no respiratory distress Heart:regular rate, distal pulses intact Abdomen: soft, nontender,  Uterine Fundus: firm, appropriately tender DVT Evaluation: No calf swelling or tenderness Extremities: no edema  Recent Labs    12/03/17 0829 12/03/17 1856  HGB 13.1 13.0  HCT 39.8 38.9    Assessment/Plan:  ASSESSMENT: Kathleen Cain is a 26 y.o. G2P1011 2966w3d s/p VAVD.  Plan for discharge tomorrow   LOS: 2 days   Oney Folz MossDO 12/04/2017, 6:51 AM

## 2017-12-03 NOTE — Progress Notes (Signed)
Vacuum applied

## 2017-12-03 NOTE — Progress Notes (Signed)
[employer] phone interpreter [phone] [facility] services for epidural preparation, education and placement for nursing staff and Dr. Hyacinth MeekerMiller [phone]).

## 2017-12-03 NOTE — Anesthesia Preprocedure Evaluation (Signed)

## 2017-12-03 NOTE — Progress Notes (Signed)
Patient doing well. Foley bulb still in place. Unable to give 3rd dose of cytotec due to contraction frequency. BPs wnl. Category 1 tracing.

## 2017-12-03 NOTE — Progress Notes (Signed)
Kathleen Cain is a 26 y.o. G2P0010 at 636w3d.  Subjective: Patient is feeling better on epidural and is progressing on pitocin  Objective: BP 116/66   Pulse 93   Temp 97.9 F (36.6 C) (Axillary)   Resp 20   Ht 5\' 4"  (1.626 m)   Wt 87.5 kg (193 lb)   SpO2 99%   BMI 33.13 kg/m    FHT:  FHR: 140 bpm, variability: appropriate,  accelerations:  15x15,  decelerations:  none UC:   Q 1-6 minutes, 30-90 Dilation: 7 Effacement (%): 80 Cervical Position: Middle Station: -2 Presentation: Vertex Exam by:: C Fisher RN  Labs: Results for orders placed or performed during the hospital encounter of 12/02/17 (from the past 24 hour(s))  CBC     Status: None   Collection Time: 12/02/17  4:33 PM  Result Value Ref Range   WBC 10.5 4.0 - 10.5 K/uL   RBC 4.47 3.87 - 5.11 MIL/uL   Hemoglobin 14.4 12.0 - 15.0 g/dL   HCT 16.143.5 09.636.0 - 04.546.0 %   MCV 97.3 78.0 - 100.0 fL   MCH 32.2 26.0 - 34.0 pg   MCHC 33.1 30.0 - 36.0 g/dL   RDW 40.914.3 81.111.5 - 91.415.5 %   Platelets 166 150 - 400 K/uL  RPR     Status: None   Collection Time: 12/02/17  4:33 PM  Result Value Ref Range   RPR Ser Ql Non Reactive Non Reactive  Type and screen     Status: None   Collection Time: 12/02/17  4:33 PM  Result Value Ref Range   ABO/RH(D) A POS    Antibody Screen NEG    Sample Expiration 12/05/2017   Comprehensive metabolic panel     Status: Abnormal   Collection Time: 12/02/17  4:33 PM  Result Value Ref Range   Sodium 133 (L) 135 - 145 mmol/L   Potassium 4.1 3.5 - 5.1 mmol/L   Chloride 102 101 - 111 mmol/L   CO2 19 (L) 22 - 32 mmol/L   Glucose, Bld 104 (H) 65 - 99 mg/dL   BUN 7 6 - 20 mg/dL   Creatinine, Ser 7.820.41 (L) 0.44 - 1.00 mg/dL   Calcium 9.2 8.9 - 95.610.3 mg/dL   Total Protein 7.2 6.5 - 8.1 g/dL   Albumin 3.3 (L) 3.5 - 5.0 g/dL   AST 29 15 - 41 U/L   ALT 13 (L) 14 - 54 U/L   Alkaline Phosphatase 324 (H) 38 - 126 U/L   Total Bilirubin 0.5 0.3 - 1.2 mg/dL   GFR calc non Af Amer >60 >60 mL/min   GFR calc Af  Amer >60 >60 mL/min   Anion gap 12 5 - 15  Protein / creatinine ratio, urine     Status: None   Collection Time: 12/03/17  5:56 AM  Result Value Ref Range   Creatinine, Urine 32.00 mg/dL   Total Protein, Urine <6 mg/dL   Protein Creatinine Ratio        0.00 - 0.15 mg/mg[Cre]  CBC     Status: Abnormal   Collection Time: 12/03/17  8:29 AM  Result Value Ref Range   WBC 12.0 (H) 4.0 - 10.5 K/uL   RBC 4.10 3.87 - 5.11 MIL/uL   Hemoglobin 13.1 12.0 - 15.0 g/dL   HCT 21.339.8 08.636.0 - 57.846.0 %   MCV 97.1 78.0 - 100.0 fL   MCH 32.0 26.0 - 34.0 pg   MCHC 32.9 30.0 - 36.0 g/dL   RDW  14.3 11.5 - 15.5 %   Platelets 128 (L) 150 - 400 K/uL    Assessment / Plan: 165w3d week IUP Labor: pitocin Fetal Wellbeing:  Category 1 Pain Control:  epidural Anticipated MOD:  svd  Marthenia RollingBland, Nicola Heinemann, DO 12/03/2017 11:52 AM

## 2017-12-03 NOTE — Anesthesia Procedure Notes (Signed)
Epidural Patient location during procedure: OB Start time: 12/03/2017 9:50 AM End time: 12/03/2017 10:04 AM  Staffing Anesthesiologist: Lowella CurbMiller, Carnelia Oscar Ray, MD Performed: anesthesiologist   Preanesthetic Checklist Completed: patient identified, site marked, surgical consent, pre-op evaluation, timeout performed, IV checked, risks and benefits discussed and monitors and equipment checked  Epidural Patient position: sitting Prep: ChloraPrep Patient monitoring: heart rate, cardiac monitor, continuous pulse ox and blood pressure Approach: midline Location: L2-L3 Injection technique: LOR saline  Needle:  Needle type: Tuohy  Needle gauge: 17 G Needle length: 9 cm Needle insertion depth: 5 cm Catheter type: closed end flexible Catheter size: 20 Guage Catheter at skin depth: 9 cm Test dose: negative  Assessment Events: blood not aspirated, injection not painful, no injection resistance, negative IV test and no paresthesia  Additional Notes Reason for block:procedure for pain

## 2017-12-03 NOTE — Progress Notes (Signed)
Labor Progress Note  Kathleen Cain is a 26 y.o. G2P0010 at 7574w3d  admitted for induction of labor due to Pre-eclamptic toxemia of pregnancy.  S: Starting to feel contractions more. Denies PIH symptoms (HA, scotomata, RUQ pain).   O:  BP 111/60   Pulse 87   Temp 98.6 F (37 C) (Oral)   Resp (!) 148   Ht 5\' 4"  (1.626 m)   Wt 87.5 kg (193 lb)   BMI 33.13 kg/m   No intake/output data recorded.  FHT:  FHR: 135 bpm, variability: moderate,  accelerations:  Present,  decelerations:  Absent UC:   regular, every 2-5 minutes SVE:   5/70/-2   Labs: Lab Results  Component Value Date   WBC 10.5 12/02/2017   HGB 14.4 12/02/2017   HCT 43.5 12/02/2017   MCV 97.3 12/02/2017   PLT 166 12/02/2017    Assessment / Plan: 26 y.o. G2P0010 5474w3d in early labor Induction of labor due to preeclampsia  Labor: Progressing normally, s/p induction with Cytotec and FB. Will start pitocin when able. Fetal Wellbeing:  Category I Pain Control:  Labor support without medications Anticipated MOD:  NSVD  Expectant management   Caryl AdaJazma Mariaisabel Bodiford, DO OB Fellow

## 2017-12-03 NOTE — Progress Notes (Signed)
99.1 axillary

## 2017-12-04 NOTE — Lactation Note (Signed)
This note was copied from a baby's chart. Lactation Consultation Note Noted baby retracting w/increased respirations. Notified RN to come to rm. RN into assess. Patient Name: Kathleen Cain ZHGDJ'MToday's Date: 12/04/2017     Maternal Data    Feeding Feeding Type: Breast Fed Length of feed: 0 min  LATCH Score                   Interventions    Lactation Tools Discussed/Used     Consult Status      Olivier Frayre G 12/04/2017, 3:51 AM

## 2017-12-04 NOTE — Anesthesia Postprocedure Evaluation (Signed)
Anesthesia Post Note  Patient: Reighan Ocanas  Procedure(s) Performed: AN AD HOC LABOR EPIDURAL     Patient location during evaluation: Mother Baby Anesthesia Type: Epidural Level of consciousness: awake and alert Pain management: pain level controlled Vital Signs Assessment: post-procedure vital signs reviewed and stable Respiratory status: spontaneous breathing, nonlabored ventilation and respiratory function stable Cardiovascular status: stable Postop Assessment: no headache, no backache, epidural receding and patient able to bend at knees Anesthetic complications: no    Last Vitals:  Vitals:   12/04/17 0100 12/04/17 0607  BP: 111/63 (!) 105/54  Pulse: 93 97  Resp: 18 18  Temp: 37.4 C 36.9 C  SpO2: 98% 100%    Last Pain:  Vitals:   12/04/17 0609  TempSrc:   PainSc: 0-No pain   Pain Goal:                 Rica RecordsICKELTON,Thayer Embleton

## 2017-12-04 NOTE — Lactation Note (Signed)
This note was copied from a baby's chart. Lactation Consultation Note Baby has increased respirations. Resp. 76. Flaring occasionally, irregular respirations. Reported to RN. Rn monitored O2 sats.  Hand expression taught. No colostrum noted from very short shaft nipples. shells given to wear in bra. MGM assisting in appyling bra on. Hand pump given.  Mom encouraged to feed baby 8-12 times/24 hours and with feeding cues. If baby hasn't waken 3 hours, wake baby for feeding.  Newborn behavior, feeding habits, STS, I&O, cluster feeding, supply and demand explained.  Encouraged to call for assistance or questions.  WH/LC brochure given w/resources, support groups and LC services.  Patient Name: Kathleen Cain AVWUJ'WToday's Date: 12/04/2017 Reason for consult: Initial assessment   Maternal Data Has patient been taught Hand Expression?: Yes Does the patient have breastfeeding experience prior to this delivery?: No  Feeding    LATCH Score Latch: Too sleepy or reluctant, no latch achieved, no sucking elicited.  Audible Swallowing: None  Type of Nipple: Everted at rest and after stimulation(short shaft)  Comfort (Breast/Nipple): Soft / non-tender        Interventions Interventions: Breast feeding basics reviewed;Breast compression;Hand pump;Support pillows;Breast massage;Position options;Hand express;Shells;Reverse pressure;Pre-pump if needed  Lactation Tools Discussed/Used Tools: Shells;Pump Shell Type: Inverted Breast pump type: Manual WIC Program: Yes Pump Review: Setup, frequency, and cleaning;Milk Storage Initiated by:: Peri JeffersonL. Asyah Candler RN IBCLC Date initiated:: 12/04/17   Consult Status Consult Status: Follow-up Date: 12/04/17 Follow-up type: In-patient    Kathleen DancerCARVER, [name] 12/04/2017, 5:23 AM

## 2017-12-05 ENCOUNTER — Ambulatory Visit: Payer: Self-pay

## 2017-12-05 MED ORDER — IBUPROFEN 600 MG PO TABS: 600.0000 mg | ORAL_TABLET | Freq: Four times a day (QID) | ORAL | 0 refills | Status: DC

## 2017-12-05 NOTE — Lactation Note (Signed)
This note was copied from a baby's chart. Lactation Consultation Note  Patient Name: Kathleen Bunnie PionLaynaddis Reining WUJWJ'XToday's Date: 12/05/2017 Reason for consult: Follow-up assessment   Follow up with parents of 3746 hour old infant. Infant with 9 BF for 10-60 minutes, formula x 3 of 14-30 cc, 3 voids and 3 stools in the last 24 hours. Infant weight 9 lb 5.2 oz with 5% weight loss since birth.   Mom reports she only has colostrum and is offering formula to supplement infant. Discussed supply and demand and milk coming to volume. Enc mom to offer breast before offering formula. Enc mom to pump and hand express post BF to stimulate supply and to offer to infant post BF. Mom has a manual pump and an electric pump at home for use. Mom has pumped once last night and not again. She did not give infant the few gtts that were pumped. Enc mom to use EBM at next feeding.   Reviewed I/O, Signs of dehydration in the infant, Engorgement prevention/treatment and breast milk expression and storage. Mom reports nipple soreness with initial latch that improves with feeding. Mom denies breasts feeling fuller today.   Inf[facility] with follow up [facility] att in 2 days [facility].   Reviewed LC Brochure, mom informed of OP services, BF Support GRoups and LC phone #. Enc mom to call with any questions/concerns as needed.    Maternal Data Has patient been taught Hand Expression?: Yes Does the patient have breastfeeding experience prior to this delivery?: No  Feeding Feeding Type: Bottle Fed - Formula Length of feed: 60 min  LATCH Score Latch: Grasps breast easily, tongue down, lips flanged, rhythmical sucking.  Audible Swallowing: A few with stimulation  Type of Nipple: Everted at rest and after stimulation  Comfort (Breast/Nipple): Soft / non-tender  Hold (Positioning): Assistance needed to correctly position infant at breast and maintain latch.  LATCH Score: 8  Interventions Interventions: Breast  feeding basics reviewed;Support pillows;Hand express  Lactation Tools Discussed/Used WIC Program: Yes   Consult Status Consult Status: Complete Follow-up type: Call as needed    Ed BlalockSharon S Kenzington Mielke 12/05/2017, 3:27 PM

## 2017-12-05 NOTE — Discharge Instructions (Signed)
Postpartum Care After Vaginal Delivery °The period of time right after you deliver your newborn is called the postpartum period. °What kind of medical care will I receive? °· You may continue to receive fluids and medicines through an IV tube inserted into one of your veins. °· If an incision was made near your vagina (episiotomy) or if you had some vaginal tearing during delivery, cold compresses may be placed on your episiotomy or your tear. This helps to reduce pain and swelling. °· You may be given a squirt bottle to use when you go to the bathroom. You may use this until you are comfortable wiping as usual. To use the squirt bottle, follow these steps: °? Before you urinate, fill the squirt bottle with warm water. Do not use hot water. °? After you urinate, while you are sitting on the toilet, use the squirt bottle to rinse the area around your urethra and vaginal opening. This rinses away any urine and blood. °? You may do this instead of wiping. As you start healing, you may use the squirt bottle before wiping yourself. Make sure to wipe gently. °? Fill the squirt bottle with clean water every time you use the bathroom. °· You will be given sanitary pads to wear. °How can I expect to feel? °· You may not feel the need to urinate for several hours after delivery. °· You will have some soreness and pain in your abdomen and vagina. °· If you are breastfeeding, you may have uterine contractions every time you breastfeed for up to several weeks postpartum. Uterine contractions help your uterus return to its normal size. °· It is normal to have vaginal bleeding (lochia) after delivery. The amount and appearance of lochia is often similar to a menstrual period in the first week after delivery. It will gradually decrease over the next few weeks to a dry, yellow-brown discharge. For most women, lochia stops completely by 6-8 weeks after delivery. Vaginal bleeding can vary from woman to woman. °· Within the first few  days after delivery, you may have breast engorgement. This is when your breasts feel heavy, full, and uncomfortable. Your breasts may also throb and feel hard, tightly stretched, warm, and tender. After this occurs, you may have milk leaking from your breasts. Your health care provider can help you relieve discomfort due to breast engorgement. Breast engorgement should go away within a few days. °· You may feel more sad or worried than normal due to hormonal changes after delivery. These feelings should not last more than a few days. If these feelings do not go away after several days, speak with your health care provider. °How should I care for myself? °· Tell your health care provider if you have pain or discomfort. °· Drink enough water to keep your urine clear or pale yellow. °· Wash your hands thoroughly with soap and water for at least 20 seconds after changing your sanitary pads, after using the toilet, and before holding or feeding your baby. °· If you are not breastfeeding, avoid touching your breasts a lot. Doing this can make your breasts produce more milk. °· If you become weak or lightheaded, or you feel like you might faint, ask for help before: °? Getting out of bed. °? Showering. °· Change your sanitary pads frequently. Watch for any changes in your flow, such as a sudden increase in volume, a change in color, the passing of large blood clots. If you pass a blood clot from your vagina, save it   to show to your health care provider. Do not flush blood clots down the toilet without having your health care provider look at them. °· Make sure that all your vaccinations are up to date. This can help protect you and your baby from getting certain diseases. You may need to have immunizations done before you leave the hospital. °· If desired, talk with your health care provider about methods of family planning or birth control (contraception). °How can I start bonding with my baby? °Spending as much time as  possible with your baby is very important. During this time, you and your baby can get to know each other and develop a bond. Having your baby stay with you in your room (rooming in) can give you time to get to know your baby. Rooming in can also help you become comfortable caring for your baby. Breastfeeding can also help you bond with your baby. °How can I plan for returning home with my baby? °· Make sure that you have a car seat installed in your vehicle. °? Your car seat should be checked by a certified car seat installer to make sure that it is installed safely. °? Make sure that your baby fits into the car seat safely. °· Ask your health care provider any questions you have about caring for yourself or your baby. Make sure that you are able to contact your health care provider with any questions after leaving the hospital. °This information is not intended to replace advice given to you by your health care provider. Make sure you discuss any questions you have with your health care provider. °Document Released: 10/03/2007 Document Revised: 05/10/2016 Document Reviewed: 11/10/2015 °Elsevier Interactive Patient Education © 2018 Elsevier Inc. ° °

## 2017-12-05 NOTE — Discharge Summary (Signed)
OB Discharge Summary     Patient Name: Kathleen Cain DOB: 11-Jul-1991 MRN: [id number]  Date of admission: 12/02/2017 Delivering MD: Frederik PearEGELE, Mionna Advincula P   Date of discharge: 12/05/2017  Admitting diagnosis: 40.2WKS INDUCTION Intrauterine pregnancy: 6265w3d     Secondary diagnosis:  Principal Problem:   Status post vacuum-assisted vaginal delivery Active Problems:   Post-dates pregnancy  Additional problems: vacuum assisted vaginal delivery Pre-eclampsia     Discharge diagnosis: Term Pregnancy Delivered                                                                                                Post partum procedures:n/a  Augmentation: Pitocin  Complications: None  Hospital course:  Induction of Labor With Vaginal Delivery   26 y.o. yo G2P1011 at 3165w3d was admitted to the hospital 12/02/2017 for induction of labor.  Indication for induction: Preeclampsia.  Patient had an uncomplicated labor course as follows: Membrane Rupture Time/Date: 6:56 AM ,12/03/2017   Intrapartum Procedures: Episiotomy: None [1]                                         Lacerations:  2nd degree [3];Perineal [11]  Patient had delivery of a Viable infant.  Information for the patient's newborn:  Emmit AlexandersHumena, Boy Abrar [[id number]][[id number]]  Delivery Method: Vaginal, Vacuum (Extractor)(Filed from Delivery Summary)   12/03/2017  Details of delivery can be found in separate delivery note.  Patient had a routine postpartum course. Patient is discharged home 12/05/17.  Physical exam  Vitals:   12/04/17 0639 12/04/17 1825 12/05/17 0700 12/05/17 0731  BP:  116/74 (!) 102/55   Pulse:  86 91   Resp:  18 18   Temp:  98.1 F (36.7 C) 98.3 F (36.8 C)   TempSrc:  Oral Oral   SpO2:      Weight: 81.7 kg (180 lb 1.6 oz)   82.2 kg (181 lb 4.8 oz)  Height:       General: alert, cooperative and no distress Lochia: appropriate Uterine Fundus: firm Incision: N/A DVT Evaluation: No evidence of DVT seen on physical  exam. Labs: Lab Results  Component Value Date   WBC 19.3 (H) 12/03/2017   HGB 13.0 12/03/2017   HCT 38.9 12/03/2017   MCV 96.5 12/03/2017   PLT 141 (L) 12/03/2017   CMP Latest Ref Rng & Units 12/02/2017  Glucose 65 - 99 mg/dL 086(V104(H)  BUN 6 - 20 mg/dL 7  Creatinine 7.840.44 - 6.961.00 mg/dL 2.95(M0.41(L)  Sodium 841135 - 324145 mmol/L 133(L)  Potassium 3.5 - 5.1 mmol/L 4.1  Chloride 101 - 111 mmol/L 102  CO2 22 - 32 mmol/L 19(L)  Calcium 8.9 - 10.3 mg/dL 9.2  Total Protein 6.5 - 8.1 g/dL 7.2  Total Bilirubin 0.3 - 1.2 mg/dL 0.5  Alkaline Phos 38 - 126 U/L 324(H)  AST 15 - 41 U/L 29  ALT 14 - 54 U/L 13(L)    Discharge instruction: per After Visit Summary and "Baby and Me Booklet".  After visit meds:  Allergies as of 12/05/2017   No Known Allergies     Medication List    TAKE these medications   ibuprofen 600 MG tablet Commonly known as:  ADVIL,MOTRIN Take 1 tablet (600 mg total) by mouth every 6 (six) hours.   prenatal multivitamin Tabs tablet Take 1 tablet by mouth daily at 12 noon.       Diet: routine diet  Activity: Advance as tolerated. Pelvic rest for 6 weeks.   Outpatient follow up: 4 weeks at HD Postpartum contraception: Progesterone only pills  Newborn Data: Live born female  Birth Weight: 9 lb 13.1 oz (4455 g) APGAR: 5, 9  Newborn Delivery   Birth date/time:  12/03/2017 16:57:00 Delivery type:  Vaginal, Vacuum (Extractor)     Baby Feeding: Bottle Disposition:home with mother   12/05/2017 Marthenia RollingScott Bland, DO  OB FELLOW DISCHARGE ATTESTATION  I have seen and examined this patient and agree with above documentation in the resident's note.   Frederik PearJulie P Miko Markwood, MD OB Fellow

## 2017-12-06 DIAGNOSIS — Z8759 Personal history of other complications of pregnancy, childbirth and the puerperium: Secondary | ICD-10-CM

## 2017-12-07 ENCOUNTER — Inpatient Hospital Stay (HOSPITAL_COMMUNITY)
Admission: RE | Admit: 2017-12-07 | Discharge: 2017-12-07 | Disposition: A | Discharge status: Home / Self Care | Payer: BLUE CROSS/BLUE SHIELD | Source: Ambulatory Visit | Attending: Family Medicine | Admitting: Family Medicine

## 2019-12-21 NOTE — L&D Delivery Note (Signed)
Delivery Note At 6:48 PM a viable female was delivered via Vaginal, Spontaneous (Presentation: Left Occiput Anterior).  APGAR: 9, 9; weight pending.   Placenta status: Spontaneous, Intact.  Cord: 3 vessels with the following complications: None.   Anesthesia: Epidural Episiotomy: None Lacerations: 2nd degree;Perineal Suture Repair: 2.0 3.0 vicryl Est. Blood Loss (mL): 368  Mom to postpartum.  Baby to Couplet care / Skin to Skin.  Alric Seton 07/28/2020, 7:22 PM

## 2020-01-17 ENCOUNTER — Other Ambulatory Visit (HOSPITAL_COMMUNITY): Payer: Self-pay | Admitting: Nurse Practitioner

## 2020-01-17 DIAGNOSIS — Z3682 Encounter for antenatal screening for nuchal translucency: Secondary | ICD-10-CM

## 2020-01-17 DIAGNOSIS — Z3A12 12 weeks gestation of pregnancy: Secondary | ICD-10-CM

## 2020-01-17 LAB — OB RESULTS CONSOLE GC/CHLAMYDIA
Chlamydia: NEGATIVE
Gonorrhea: NEGATIVE

## 2020-01-17 LAB — HEPATITIS C ANTIBODY: HCV Ab: NEGATIVE

## 2020-01-17 LAB — OB RESULTS CONSOLE RPR: RPR: NONREACTIVE

## 2020-01-17 LAB — OB RESULTS CONSOLE HIV ANTIBODY (ROUTINE TESTING): HIV: NONREACTIVE

## 2020-01-17 LAB — OB RESULTS CONSOLE HEPATITIS B SURFACE ANTIGEN: Hepatitis B Surface Ag: NEGATIVE

## 2020-01-18 ENCOUNTER — Encounter (HOSPITAL_COMMUNITY): Payer: Self-pay | Admitting: *Deleted

## 2020-01-21 ENCOUNTER — Other Ambulatory Visit (HOSPITAL_COMMUNITY): Payer: Self-pay | Admitting: Family Medicine

## 2020-01-21 DIAGNOSIS — Z3682 Encounter for antenatal screening for nuchal translucency: Secondary | ICD-10-CM

## 2020-01-21 DIAGNOSIS — Z3A12 12 weeks gestation of pregnancy: Secondary | ICD-10-CM

## 2020-01-22 ENCOUNTER — Encounter (HOSPITAL_COMMUNITY): Payer: Self-pay

## 2020-01-22 ENCOUNTER — Ambulatory Visit (HOSPITAL_COMMUNITY): Payer: Medicaid Other | Admitting: *Deleted

## 2020-01-22 ENCOUNTER — Ambulatory Visit (HOSPITAL_COMMUNITY)
Admission: RE | Admit: 2020-01-22 | Discharge: 2020-01-22 | Disposition: A | Discharge status: Home / Self Care | Payer: Medicaid Other | Source: Ambulatory Visit | Attending: Obstetrics and Gynecology | Admitting: Obstetrics and Gynecology

## 2020-01-22 ENCOUNTER — Other Ambulatory Visit: Payer: Self-pay

## 2020-01-22 ENCOUNTER — Ambulatory Visit (HOSPITAL_COMMUNITY): Payer: Medicaid Other

## 2020-01-22 VITALS — BP 119/75 | HR 89 | Temp 98.1°F | Wt 158.8 lb

## 2020-01-22 DIAGNOSIS — O09291 Supervision of pregnancy with other poor reproductive or obstetric history, first trimester: Secondary | ICD-10-CM | POA: Diagnosis not present

## 2020-01-22 DIAGNOSIS — Z3A13 13 weeks gestation of pregnancy: Secondary | ICD-10-CM | POA: Diagnosis not present

## 2020-01-22 DIAGNOSIS — Z3682 Encounter for antenatal screening for nuchal translucency: Secondary | ICD-10-CM

## 2020-01-22 DIAGNOSIS — Z36 Encounter for antenatal screening for chromosomal anomalies: Secondary | ICD-10-CM

## 2020-01-22 DIAGNOSIS — Z3A12 12 weeks gestation of pregnancy: Secondary | ICD-10-CM

## 2020-01-28 ENCOUNTER — Other Ambulatory Visit (HOSPITAL_COMMUNITY): Payer: Self-pay

## 2020-01-28 LAB — MATERNIT21 PLUS CORE+SCA
Fetal Fraction: 10
Monosomy X (Turner Syndrome): NOT DETECTED
Result (T21): NEGATIVE
Trisomy 13 (Patau syndrome): NEGATIVE
Trisomy 18 (Edwards syndrome): NEGATIVE
Trisomy 21 (Down syndrome): NEGATIVE
XXX (Triple X Syndrome): NOT DETECTED
XXY (Klinefelter Syndrome): NOT DETECTED
XYY (Jacobs Syndrome): NOT DETECTED

## 2020-01-29 ENCOUNTER — Telehealth (HOSPITAL_COMMUNITY): Payer: Self-pay | Admitting: Genetic Counselor

## 2020-01-29 NOTE — Telephone Encounter (Signed)
I called Kathleen Cain to discuss her negative noninvasive prenatal screening (NIPS)/cell free DNA (cfDNA) testing result. Specifically, Kathleen Cain had MaterniT21 testing through [facility]. These negative results demonstrated an expected representation of chromosome 21, 18, 13, and sex chromosome material, greatly reducing the likelihood of trisomies 85, 45, or 56 and sex chromosome aneuploidies for the pregnancy. Kathleen Cain to know about the expected fetal sex, which is female. She was thrilled to hear this news.  NIPS analyzes placental (fetal) DNA in maternal circulation. NIPS is considered to be highly specific and sensitive, but is not considered to be a diagnostic test. We reviewed that this testing identifies 91-99% of pregnancies with trisomies 48, 89, and 24, as well as sex chromosome abnormalities, but does not test for all genetic conditions. Diagnostic testing via amniocentesis is available any time after 16 weeks' gestation should she be interested in confirming this result. She confirmed that she had no questions about these results at this time.  Kathleen Crane, MS Genetic Counselor

## 2020-05-05 LAB — OB RESULTS CONSOLE HIV ANTIBODY (ROUTINE TESTING): HIV: NONREACTIVE

## 2020-05-05 LAB — OB RESULTS CONSOLE RPR: RPR: NONREACTIVE

## 2020-07-03 LAB — OB RESULTS CONSOLE GBS: GBS: NEGATIVE

## 2020-07-03 LAB — OB RESULTS CONSOLE GC/CHLAMYDIA
Chlamydia: NEGATIVE
Gonorrhea: NEGATIVE

## 2020-07-04 LAB — OB RESULTS CONSOLE VARICELLA ZOSTER ANTIBODY, IGG: Varicella: IMMUNE

## 2020-07-09 LAB — OB RESULTS CONSOLE RUBELLA ANTIBODY, IGM: Rubella: IMMUNE

## 2020-07-28 ENCOUNTER — Inpatient Hospital Stay (HOSPITAL_COMMUNITY): Payer: Medicaid Other | Admitting: Anesthesiology

## 2020-07-28 ENCOUNTER — Encounter (HOSPITAL_COMMUNITY): Payer: Self-pay | Admitting: Obstetrics and Gynecology

## 2020-07-28 ENCOUNTER — Inpatient Hospital Stay (HOSPITAL_COMMUNITY)
Admission: AD | Admit: 2020-07-28 | Discharge: 2020-07-29 | DRG: 807 | Disposition: A | Discharge status: Home / Self Care | Payer: Medicaid Other | Attending: Obstetrics & Gynecology | Admitting: Obstetrics & Gynecology

## 2020-07-28 ENCOUNTER — Other Ambulatory Visit: Payer: Self-pay

## 2020-07-28 DIAGNOSIS — Z3A40 40 weeks gestation of pregnancy: Secondary | ICD-10-CM

## 2020-07-28 DIAGNOSIS — O4290 Premature rupture of membranes, unspecified as to length of time between rupture and onset of labor, unspecified weeks of gestation: Secondary | ICD-10-CM | POA: Diagnosis present

## 2020-07-28 DIAGNOSIS — Z8759 Personal history of other complications of pregnancy, childbirth and the puerperium: Secondary | ICD-10-CM

## 2020-07-28 DIAGNOSIS — Z20822 Contact with and (suspected) exposure to covid-19: Secondary | ICD-10-CM | POA: Diagnosis present

## 2020-07-28 DIAGNOSIS — O4202 Full-term premature rupture of membranes, onset of labor within 24 hours of rupture: Secondary | ICD-10-CM

## 2020-07-28 DIAGNOSIS — O4292 Full-term premature rupture of membranes, unspecified as to length of time between rupture and onset of labor: Principal | ICD-10-CM | POA: Diagnosis present

## 2020-07-28 DIAGNOSIS — O480 Post-term pregnancy: Secondary | ICD-10-CM | POA: Diagnosis present

## 2020-07-28 DIAGNOSIS — O039 Complete or unspecified spontaneous abortion without complication: Secondary | ICD-10-CM | POA: Diagnosis present

## 2020-07-28 LAB — POCT FERN TEST: POCT Fern Test: POSITIVE

## 2020-07-28 LAB — TYPE AND SCREEN
ABO/RH(D): A POS
Antibody Screen: NEGATIVE

## 2020-07-28 LAB — CBC
HCT: 41.2 % (ref 36.0–46.0)
Hemoglobin: 13.4 g/dL (ref 12.0–15.0)
MCH: 31.1 pg (ref 26.0–34.0)
MCHC: 32.5 g/dL (ref 30.0–36.0)
MCV: 95.6 fL (ref 80.0–100.0)
Platelets: 174 10*3/uL (ref 150–400)
RBC: 4.31 MIL/uL (ref 3.87–5.11)
RDW: 14.3 % (ref 11.5–15.5)
WBC: 9.4 10*3/uL (ref 4.0–10.5)
nRBC: 0 % (ref 0.0–0.2)

## 2020-07-28 LAB — SARS CORONAVIRUS 2 BY RT PCR (HOSPITAL ORDER, PERFORMED IN ~~LOC~~ HOSPITAL LAB): SARS Coronavirus 2: NEGATIVE

## 2020-07-28 MED ORDER — PHENYLEPHRINE 40 MCG/ML (10ML) SYRINGE FOR IV PUSH (FOR BLOOD PRESSURE SUPPORT): 80.0000 ug | PREFILLED_SYRINGE | INTRAVENOUS | Status: DC | PRN

## 2020-07-28 MED ORDER — SODIUM CHLORIDE (PF) 0.9 % IJ SOLN
INTRAMUSCULAR | Status: DC | PRN
  Administered 2020-07-28: 12 mL/h via EPIDURAL

## 2020-07-28 MED ORDER — PRENATAL MULTIVITAMIN CH
1.0000 | ORAL_TABLET | Freq: Every day | ORAL | Status: DC
  Administered 2020-07-29: 1 via ORAL
  Filled 2020-07-28: qty 1

## 2020-07-28 MED ORDER — MISOPROSTOL 50MCG HALF TABLET
ORAL_TABLET | ORAL | Status: AC
  Filled 2020-07-28: qty 1

## 2020-07-28 MED ORDER — COCONUT OIL OIL: 1.0000 "application " | TOPICAL_OIL | Status: DC | PRN

## 2020-07-28 MED ORDER — OXYTOCIN BOLUS FROM INFUSION
333.0000 mL | Freq: Once | INTRAVENOUS | Status: AC
  Administered 2020-07-28: 333 mL via INTRAVENOUS

## 2020-07-28 MED ORDER — IBUPROFEN 600 MG PO TABS
600.0000 mg | ORAL_TABLET | Freq: Four times a day (QID) | ORAL | Status: DC
  Administered 2020-07-28 – 2020-07-29 (×3): 600 mg via ORAL
  Filled 2020-07-28 (×5): qty 1

## 2020-07-28 MED ORDER — LIDOCAINE HCL (PF) 1 % IJ SOLN: 30.0000 mL | INTRAMUSCULAR | Status: DC | PRN

## 2020-07-28 MED ORDER — LACTATED RINGERS IV SOLN: INTRAVENOUS | Status: DC

## 2020-07-28 MED ORDER — OXYCODONE HCL 5 MG PO TABS: 5.0000 mg | ORAL_TABLET | ORAL | Status: DC | PRN

## 2020-07-28 MED ORDER — ACETAMINOPHEN 325 MG PO TABS: 650.0000 mg | ORAL_TABLET | ORAL | Status: DC | PRN

## 2020-07-28 MED ORDER — DIBUCAINE (PERIANAL) 1 % EX OINT: 1.0000 "application " | TOPICAL_OINTMENT | CUTANEOUS | Status: DC | PRN

## 2020-07-28 MED ORDER — FENTANYL CITRATE (PF) 100 MCG/2ML IJ SOLN: 100.0000 ug | INTRAMUSCULAR | Status: DC | PRN

## 2020-07-28 MED ORDER — MAGNESIUM HYDROXIDE 400 MG/5ML PO SUSP: 30.0000 mL | ORAL | Status: DC | PRN

## 2020-07-28 MED ORDER — OXYCODONE HCL 5 MG PO TABS: 10.0000 mg | ORAL_TABLET | ORAL | Status: DC | PRN

## 2020-07-28 MED ORDER — EPHEDRINE 5 MG/ML INJ: 10.0000 mg | INTRAVENOUS | Status: DC | PRN

## 2020-07-28 MED ORDER — TETANUS-DIPHTH-ACELL PERTUSSIS 5-2.5-18.5 LF-MCG/0.5 IM SUSP: 0.5000 mL | Freq: Once | INTRAMUSCULAR | Status: DC

## 2020-07-28 MED ORDER — ONDANSETRON HCL 4 MG/2ML IJ SOLN: 4.0000 mg | Freq: Four times a day (QID) | INTRAMUSCULAR | Status: DC | PRN

## 2020-07-28 MED ORDER — FENTANYL-BUPIVACAINE-NACL 0.5-0.125-0.9 MG/250ML-% EP SOLN
12.0000 mL/h | EPIDURAL | Status: DC | PRN
  Filled 2020-07-28: qty 250

## 2020-07-28 MED ORDER — TERBUTALINE SULFATE 1 MG/ML IJ SOLN: 0.2500 mg | Freq: Once | INTRAMUSCULAR | Status: DC | PRN

## 2020-07-28 MED ORDER — OXYTOCIN-SODIUM CHLORIDE 30-0.9 UT/500ML-% IV SOLN
2.5000 [IU]/h | INTRAVENOUS | Status: DC
  Filled 2020-07-28: qty 500

## 2020-07-28 MED ORDER — DIPHENHYDRAMINE HCL 25 MG PO CAPS: 25.0000 mg | ORAL_CAPSULE | Freq: Four times a day (QID) | ORAL | Status: DC | PRN

## 2020-07-28 MED ORDER — ONDANSETRON HCL 4 MG/2ML IJ SOLN: 4.0000 mg | INTRAMUSCULAR | Status: DC | PRN

## 2020-07-28 MED ORDER — LACTATED RINGERS IV SOLN
500.0000 mL | Freq: Once | INTRAVENOUS | Status: AC
  Administered 2020-07-28: 500 mL via INTRAVENOUS

## 2020-07-28 MED ORDER — WITCH HAZEL-GLYCERIN EX PADS: 1.0000 "application " | MEDICATED_PAD | CUTANEOUS | Status: DC | PRN

## 2020-07-28 MED ORDER — PHENYLEPHRINE 40 MCG/ML (10ML) SYRINGE FOR IV PUSH (FOR BLOOD PRESSURE SUPPORT)
80.0000 ug | PREFILLED_SYRINGE | INTRAVENOUS | Status: DC | PRN
  Filled 2020-07-28: qty 10

## 2020-07-28 MED ORDER — DIPHENHYDRAMINE HCL 50 MG/ML IJ SOLN: 12.5000 mg | INTRAMUSCULAR | Status: DC | PRN

## 2020-07-28 MED ORDER — SOD CITRATE-CITRIC ACID 500-334 MG/5ML PO SOLN: 30.0000 mL | ORAL | Status: DC | PRN

## 2020-07-28 MED ORDER — BENZOCAINE-MENTHOL 20-0.5 % EX AERO
1.0000 "application " | INHALATION_SPRAY | CUTANEOUS | Status: DC | PRN
  Administered 2020-07-29: 1 via TOPICAL
  Filled 2020-07-28: qty 56

## 2020-07-28 MED ORDER — ONDANSETRON HCL 4 MG PO TABS: 4.0000 mg | ORAL_TABLET | ORAL | Status: DC | PRN

## 2020-07-28 MED ORDER — LACTATED RINGERS IV SOLN: 500.0000 mL | INTRAVENOUS | Status: DC | PRN

## 2020-07-28 MED ORDER — LIDOCAINE HCL (PF) 1 % IJ SOLN
INTRAMUSCULAR | Status: DC | PRN
  Administered 2020-07-28: 10 mL via EPIDURAL

## 2020-07-28 MED ORDER — SENNOSIDES-DOCUSATE SODIUM 8.6-50 MG PO TABS
2.0000 | ORAL_TABLET | ORAL | Status: DC
  Administered 2020-07-28: 2 via ORAL
  Filled 2020-07-28: qty 2

## 2020-07-28 MED ORDER — MISOPROSTOL 50MCG HALF TABLET
50.0000 ug | ORAL_TABLET | ORAL | Status: DC | PRN
  Administered 2020-07-28: 50 ug via BUCCAL

## 2020-07-28 MED ORDER — SIMETHICONE 80 MG PO CHEW: 80.0000 mg | CHEWABLE_TABLET | ORAL | Status: DC | PRN

## 2020-07-28 NOTE — H&P (Addendum)
OBSTETRIC ADMISSION HISTORY AND PHYSICAL  Kathleen Cain is a 29 y.o. female G3P1011 with IUP at [redacted]w[redacted]d by LMP (consistent with 2nd trimester [place]) presenting for IOL following PROM (0930 on 07/28/20). She reports +FMs, No LOF, no VB, no blurry vision, headaches or peripheral edema, and RUQ pain.  She plans on breast and bottle feeding. She is currently unsure about post-partum birth control method, mentions wanting the "pills." She received her prenatal care at [facility]   Dating: By LMP --->  Estimated Date of Delivery: 07/27/20  Sono:    @[redacted]w[redacted]d , CWD, normal anatomy, cephalic presentation, 50%EFW.   Prenatal History/Complications: None  Past Medical History: - Hx of Preeclampsia (G2) - Hx of fetal macrosomia, >4000 g (G2)  Past Surgical History: Past Surgical History:  Procedure Laterality Date  . NO PAST SURGERIES      Obstetrical History: OB History    Gravida  3   Para  1   Term  1   Preterm      AB  1   Living  1     SAB  1   TAB      Ectopic      Multiple  0   Live Births  1           Social History Social History   Socioeconomic History  . Marital status: Single    Spouse name: Not on file  . Number of children: Not on file  . Years of education: Not on file  . Highest education level: Not on file  Occupational History  . Not on file  Tobacco Use  . Smoking status: Never Smoker  . Smokeless tobacco: Never Used  Vaping Use  . Vaping Use: Never used  Substance and Sexual Activity  . Alcohol use: No  . Drug use: No  . Sexual activity: Yes    Birth control/protection: None  Other Topics Concern  . Not on file  Social History Narrative  . Not on file   Social Determinants of Health   Financial Resource Strain:   . Difficulty of Paying Living Expenses:   Food Insecurity:   . Worried About in the Last Year:   . Programme researcher, broadcasting/film/video in the Last Year:   Transportation Needs:   . Barista (Medical):   Freight forwarder Lack of  Transportation (Non-Medical):   Physical Activity:   . Days of Exercise per Week:   . Minutes of Exercise per Session:   Stress:   . Feeling of Stress :   Social Connections:   . Frequency of Communication with Friends and Family:   . Frequency of Social Gatherings with Friends and Family:   . Attends Religious Services:   . Active Member of Clubs or Organizations:   . Attends [employer] Meetings:   Banker Marital Status:     Family History: Family History  Problem Relation Age of Onset  . Cervical cancer Mother     Allergies: No Known Allergies  Medications Prior to Admission  Medication Sig Dispense Refill Last Dose  . Prenatal Vit-Fe Fumarate-FA (PRENATAL MULTIVITAMIN) TABS tablet Take 1 tablet by mouth daily at 12 noon.   07/27/2020 at Unknown time  . aspirin EC 81 MG tablet Take 81 mg by mouth daily.   More than a month at Unknown time  . ibuprofen (ADVIL,MOTRIN) 600 MG tablet Take 1 tablet (600 mg total) by mouth every 6 (six) hours. (Patient not taking: Reported on  01/22/2020) 60 tablet 0      Review of Systems   All systems reviewed and negative except as stated in HPI  Blood pressure 130/73, pulse (!) 103, temperature 98.7 F (37.1 C), temperature source Oral, resp. rate 18, height 5\' 5"  (1.651 m), weight 85 kg, last menstrual period 10/21/2019, SpO2 99 %, unknown if currently breastfeeding. General appearance: alert and no distress Lungs: clear to auscultation bilaterally Heart: regular rate and rhythm Abdomen: soft, non-tender; bowel sounds normal Pelvic: cervical exam as below Extremities: Homans sign is negative, no sign of DVT Presentation: cephalic Fetal monitoringBaseline: 150 bpm, Variability: Good {> 6 bpm), Accelerations: Reactive and Decelerations: Absent Uterine activity: contractions q3-5 minutes Dilation: Closed Effacement (%): Thick Station: -3 Exam by:: Dr. [email or web address]   Prenatal labs: ABO, Rh: --/--/A POS (08/09 1150)A positive Antibody:  NEG (08/09 1150)Negative Rubella:  Immune RPR:   Negative (05/05/20) HBsAg:   Non Reactive (01/17/20) HIV:   Non Reactive (01/17/20) GBS:   Negative (07/03/20) 1 hr Glucola Normal (82; 05/05/20) Genetic screening low risk Anatomy 05/07/20 normal  Prenatal Transfer Tool  Maternal Diabetes: No Genetic Screening: Normal Maternal Ultrasounds/Referrals: Normal Fetal Ultrasounds or other Referrals:  None Maternal Substance Abuse:  No Significant Maternal Medications:  None Significant Maternal Lab Results: Group B Strep negative  Results for orders placed or performed during the hospital encounter of 07/28/20 (from the past 24 hour(s))  Fern Test   Collection Time: 07/28/20 11:22 AM  Result Value Ref Range   POCT Fern Test Positive = ruptured amniotic membanes   CBC   Collection Time: 07/28/20 11:41 AM  Result Value Ref Range   WBC 9.4 4.0 - 10.5 K/uL   RBC 4.31 3.87 - 5.11 MIL/uL   Hemoglobin 13.4 12.0 - 15.0 g/dL   HCT 09/27/20 36 - 46 %   MCV 95.6 80.0 - 100.0 fL   MCH 31.1 26.0 - 34.0 pg   MCHC 32.5 30.0 - 36.0 g/dL   RDW 99.2 42.6 - 83.4 %   Platelets 174 150 - 400 K/uL   nRBC 0.0 0.0 - 0.2 %  Type and screen [facility]   Collection Time: 07/28/20 11:50 AM  Result Value Ref Range   ABO/RH(D) A POS    Antibody Screen NEG    Sample Expiration      07/31/2020,2359 Performed at [facility], [address], [place], [place]     Patient Active Problem List   Diagnosis Date Noted  . Premature rupture of membranes 07/28/2020  . Status post vacuum-assisted vaginal delivery 12/06/2017  . Post-dates pregnancy 12/02/2017  . SAB (spontaneous abortion) 01/18/2017    Assessment/Plan:  Kathleen Cain is a 29 y.o. G3P1011 at [redacted]w[redacted]d here for IOL following PROM this morning.  #Labor: Will initiate IOL with cytotec, continue to augment as necessary. #Pain: PRN #FWB: Category I #ID: GBS neg #MOF: both #MOC: undecided # History of preeclampsia with first  pregnancy: ASA until 36w. No elevated BP this pregnancy, will monitor Bps this admission. No preE labs given well controlled BP. #History of fetal macrosomia: G2 >4500g, EFW last u/s 50%ile.  [redacted]w[redacted]d, MD  07/28/2020, 2:05 PM  GME ATTESTATION:  I saw and evaluated the patient. I agree with the findings and the plan of care as documented in the resident's note.  09/27/2020, MD OB Fellow, Faculty [facility], Center for [facility] 07/28/2020 2:09 PM

## 2020-07-28 NOTE — Discharge Summary (Signed)
Postpartum Discharge Summary      Patient Name: Kathleen Cain DOB: 06-Aug-1991 MRN: [id number]  Date of admission: 07/28/2020 Delivery date:07/28/2020  Delivering provider: Arrie Senate  Date of discharge: 07/29/2020  Admitting diagnosis: Premature rupture of membranes [O42.90] Intrauterine pregnancy: [redacted]w[redacted]d    Secondary diagnosis:  Active Problems:   SAB (spontaneous abortion)   Post-dates pregnancy   Status post vacuum-assisted vaginal delivery   Premature rupture of membranes   Single liveborn, born in hospital, delivered by vaginal delivery  Additional problems: none    Discharge diagnosis: Term Pregnancy Delivered                                              Post partum procedures:none Augmentation: Cytotec Complications: None  Hospital course: Induction of Labor With Vaginal Delivery   29y.o. yo G3P1011 at 461w1das admitted to the hospital 07/28/2020 for induction of labor.  Indication for induction: PROM.  Patient had an uncomplicated labor course as follows: Membrane Rupture Time/Date: 3:30 AM ,07/28/2020   Delivery Method:Vaginal, Spontaneous  Episiotomy: None  Lacerations:  2nd degree;Perineal  Details of delivery can be found in separate delivery note.  Patient had a routine postpartum course. Patient is discharged home 07/29/20.  Newborn Data: Birth date:07/28/2020  Birth time:6:48 PM  Gender:Female  Living status:Living  Apgars:9 ,9  Weight:4026 g   Magnesium Sulfate received: No BMZ received: No Rhophylac:N/A MMR:N/A T-DaP:Given prenatally Flu: N/A Transfusion:No  Physical exam  Vitals:   07/28/20 2145 07/29/20 0149 07/29/20 0540 07/29/20 1008  BP: 114/83 121/71 125/80 100/68  Pulse: 81 77 77 90  Resp: _0 Temp: 97.6 F (36.4 C) 98.2 F (36.8 C) 97.9 F (36.6 C) 98.7 F (37.1 C)  TempSrc: Oral Oral Oral Oral  SpO2: 100% 100% 98%   Weight:      Height:       General: alert, cooperative and no distress Lochia:  appropriate Uterine Fundus: firm Incision: N/A DVT Evaluation: No evidence of DVT seen on physical exam. Labs: Lab Results  Component Value Date   WBC 14.7 (H) 07/29/2020   HGB 12.9 07/29/2020   HCT 39.3 07/29/2020   MCV 94.0 07/29/2020   PLT 164 07/29/2020   CMP Latest Ref Rng & Units 12/02/2017  Glucose 65 - 99 mg/dL 104(H)  BUN 6 - 20 mg/dL 7  Creatinine 0.44 - 1.00 mg/dL 0.41(L)  Sodium 135 - 145 mmol/L 133(L)  Potassium 3.5 - 5.1 mmol/L 4.1  Chloride 101 - 111 mmol/L 102  CO2 22 - 32 mmol/L 19(L)  Calcium 8.9 - 10.3 mg/dL 9.2  Total Protein 6.5 - 8.1 g/dL 7.2  Total Bilirubin 0.3 - 1.2 mg/dL 0.5  Alkaline Phos 38 - 126 U/L 324(H)  AST 15 - 41 U/L 29  ALT 14 - 54 U/L 13(L)   Edinburgh Score: Edinburgh Postnatal Depression Scale Screening Tool 07/29/2020  I have been able to laugh and see the funny side of things. 0  I have looked forward with enjoyment to things. 0  I have blamed myself unnecessarily when things went wrong. 0  I have been anxious or worried for no good reason. 0  I have felt scared or panicky for no good reason. 0  Things have been getting on top of me. 0  I have been so unhappy that I have  had difficulty sleeping. 0  I have felt sad or miserable. 0  I have been so unhappy that I have been crying. 0  The thought of harming myself has occurred to me. 0  Edinburgh Postnatal Depression Scale Total 0     After visit meds:  Allergies as of 07/29/2020   No Known Allergies     Medication List    STOP taking these medications   aspirin EC 81 MG tablet     TAKE these medications   acetaminophen 500 MG tablet Commonly known as: TYLENOL Take 2 tablets (1,000 mg total) by mouth every 4 (four) hours as needed (for pain scale < 4).   ibuprofen 600 MG tablet Commonly known as: ADVIL Take 1 tablet (600 mg total) by mouth every 6 (six) hours. What changed: Another medication with the same name was added. Make sure you understand how and when to take  each.   ibuprofen 600 MG tablet Commonly known as: ADVIL Take 1 tablet (600 mg total) by mouth every 6 (six) hours. What changed: You were already taking a medication with the same name, and this prescription was added. Make sure you understand how and when to take each.   norethindrone 0.35 MG tablet Commonly known as: Ortho Micronor Take 1 tablet (0.35 mg total) by mouth daily.   prenatal multivitamin Tabs tablet Take 1 tablet by mouth daily at 12 noon.        Discharge home in stable condition Infant Feeding: Bottle and Breast Infant Disposition:home with mother Discharge instruction: per After Visit Summary and Postpartum booklet. Activity: Advance as tolerated. Pelvic rest for 6 weeks.  Diet: routine diet Future Appointments:No future appointments. Follow up Visit:   Please schedule this patient for a In person postpartum visit in 4 weeks with the following provider: Any provider. Additional Postpartum F/U:n/a  Low risk pregnancy complicated by: none Delivery mode:  Vaginal, Spontaneous  Anticipated Birth Control:  POPs   3/70/0525 Arrie Senate, MD

## 2020-07-28 NOTE — MAU Note (Signed)
Presents with c/o LOF that began @ 0930 this morning, reports fluids clear.  Denies VB.  Endorses +FM.

## 2020-07-28 NOTE — Progress Notes (Signed)
Confirmed vertex on bedside ultrasound.  Sheila Oats, MD OB Fellow, Faculty Practice 07/28/2020 11:30 AM

## 2020-07-28 NOTE — Progress Notes (Addendum)
Labor Progress Note Kathleen Cain is a 29 y.o. G3P1011 with IUP at [redacted]w[redacted]d presented for IOL following PROM (0930 on 8/9). S: Doing well, without complaints.  O:  BP 109/63   Pulse 81   Temp 98.7 F (37.1 C) (Oral)   Resp 16   Ht 5\' 5"  (1.651 m)   Wt 85 kg   LMP 10/21/2019   SpO2 99%   BMI 31.17 kg/m  EFM: baseline 140bpm/moderate variability/accelerations present/no decelerations Toco: q 1-3 min  CVE: Dilation: 4 Effacement (%): 50 Station: -2 Presentation: Vertex Exam by:: Dr. [email or web address]   A&P: 29 y.o. 30 [redacted]w[redacted]d who presented for IOL following PROM (at 0930 on 07/28/20) #Labor: Progressing well. Will hold off on pitocin as patient having consistent contractions q 1-3 min. Will augment PRN. #Pain: Epidural PRN #FWB: Category I #GBS negative # History of preeclampsia with first pregnancy: ASA until 36w. No elevated BP this pregnancy, will monitor Bps this admission. No preE labs given well controlled BP. #History of fetal macrosomia: G2 >4500g, EFW last u/s 50%ile.  09/27/20, Medical Student 4:56 PM   GME ATTESTATION:  I saw and evaluated the patient. I agree with the findings and the plan of care as documented in the resident's note.  Kathleen Lofts, MD OB Fellow, Faculty [facility], Center for [facility] 07/28/2020 6:27 PM

## 2020-07-28 NOTE — Discharge Instructions (Signed)

## 2020-07-28 NOTE — Anesthesia Procedure Notes (Signed)
Epidural Patient location during procedure: OB Start time: 07/28/2020 5:24 PM End time: 07/28/2020 5:32 PM  Staffing Anesthesiologist: Lucretia Kern, MD Performed: anesthesiologist   Preanesthetic Checklist Completed: patient identified, IV checked, risks and benefits discussed, monitors and equipment checked, pre-op evaluation and timeout performed  Epidural Patient position: sitting Prep: DuraPrep Patient monitoring: heart rate, continuous pulse ox and blood pressure Approach: midline Location: L3-L4 Injection technique: LOR air  Needle:  Needle type: Tuohy  Needle gauge: 17 G Needle length: 9 cm Needle insertion depth: 5 cm Catheter type: closed end flexible Catheter size: 19 Gauge Catheter at skin depth: 10 cm Test dose: negative  Assessment Events: blood not aspirated, injection not painful, no injection resistance, no paresthesia and negative IV test  Additional Notes Reason for block:procedure for pain

## 2020-07-28 NOTE — Anesthesia Preprocedure Evaluation (Signed)

## 2020-07-29 LAB — CBC
HCT: 39.3 % (ref 36.0–46.0)
Hemoglobin: 12.9 g/dL (ref 12.0–15.0)
MCH: 30.9 pg (ref 26.0–34.0)
MCHC: 32.8 g/dL (ref 30.0–36.0)
MCV: 94 fL (ref 80.0–100.0)
Platelets: 164 10*3/uL (ref 150–400)
RBC: 4.18 MIL/uL (ref 3.87–5.11)
RDW: 14.5 % (ref 11.5–15.5)
WBC: 14.7 10*3/uL — ABNORMAL HIGH (ref 4.0–10.5)
nRBC: 0 % (ref 0.0–0.2)

## 2020-07-29 LAB — RPR: RPR Ser Ql: NONREACTIVE

## 2020-07-29 MED ORDER — ACETAMINOPHEN 500 MG PO TABS: 1000.0000 mg | ORAL_TABLET | ORAL | 0 refills | Status: DC | PRN

## 2020-07-29 MED ORDER — IBUPROFEN 600 MG PO TABS: 600.0000 mg | ORAL_TABLET | Freq: Four times a day (QID) | ORAL | 0 refills | Status: DC

## 2020-07-29 MED ORDER — NORETHINDRONE 0.35 MG PO TABS
1.0000 | ORAL_TABLET | Freq: Every day | ORAL | 11 refills | Status: DC
Start: 2020-07-29 — End: 2021-07-16

## 2020-07-29 NOTE — Anesthesia Postprocedure Evaluation (Signed)
Anesthesia Post Note  Patient: Kathleen Cain  Procedure(s) Performed: AN AD HOC LABOR EPIDURAL     Patient location during evaluation: Mother Baby Anesthesia Type: Epidural Level of consciousness: awake and alert Pain management: pain level controlled Vital Signs Assessment: post-procedure vital signs reviewed and stable Respiratory status: spontaneous breathing, nonlabored ventilation and respiratory function stable Cardiovascular status: stable Postop Assessment: no headache, no backache and epidural receding Anesthetic complications: no   No complications documented.  Last Vitals:  Vitals:   07/29/20 0149 07/29/20 0540  BP: 121/71 125/80  Pulse: 77 77  Resp: 20 18  Temp: 36.8 C 36.6 C  SpO2: 100% 98%    Last Pain:  Vitals:   07/29/20 0719  TempSrc:   PainSc: 0-No pain   Pain Goal:                   Ahan Eisenberger

## 2020-10-31 ENCOUNTER — Other Ambulatory Visit: Payer: Self-pay

## 2020-10-31 ENCOUNTER — Encounter (HOSPITAL_COMMUNITY): Payer: Self-pay | Admitting: Emergency Medicine

## 2020-10-31 ENCOUNTER — Emergency Department (HOSPITAL_COMMUNITY)
Admission: EM | Admit: 2020-10-31 | Discharge: 2020-10-31 | Disposition: A | Discharge status: Home / Self Care | Payer: Medicaid Other | Attending: Emergency Medicine | Admitting: Emergency Medicine

## 2020-10-31 ENCOUNTER — Emergency Department (HOSPITAL_COMMUNITY): Payer: Medicaid Other

## 2020-10-31 DIAGNOSIS — M25521 Pain in right elbow: Secondary | ICD-10-CM | POA: Diagnosis present

## 2020-10-31 MED ORDER — PREDNISONE 10 MG (21) PO TBPK
ORAL_TABLET | Freq: Every day | ORAL | 0 refills | Status: DC
Start: 2020-10-31 — End: 2021-07-16

## 2020-10-31 MED ORDER — PREDNISONE 20 MG PO TABS
60.0000 mg | ORAL_TABLET | Freq: Once | ORAL | Status: AC
  Administered 2020-10-31: 60 mg via ORAL
  Filled 2020-10-31: qty 3

## 2020-10-31 MED ORDER — IBUPROFEN 400 MG PO TABS
600.0000 mg | ORAL_TABLET | Freq: Once | ORAL | Status: AC
  Administered 2020-10-31: 600 mg via ORAL
  Filled 2020-10-31: qty 1

## 2020-10-31 NOTE — ED Provider Notes (Signed)
[facility] EMERGENCY DEPARTMENT Provider Note   CSN: [id number] Arrival date & time: 10/31/20  1716     History Chief Complaint  Patient presents with  . Elbow Pain    Kathleen Cain is a 29 y.o. female Who presents today for evaluation of pain in her right elbow.  Her pain started 2 days ago.  She woke up with the pain.  She denies any inciting event.  No fevers.  She feels like the elbow is slightly swollen.  She denies current or history of wounds in the area.  She otherwise feels well.  She does not have any significant renal history.  No history of gout.  No injections or venipunctures in the right arm.  No history of IV drug use.  Patient is breast feeding.   HPI     Past Medical History:  Diagnosis Date  . Medical history non-contributory     Patient Active Problem List   Diagnosis Date Noted  . Premature rupture of membranes 07/28/2020  . Single liveborn, born in hospital, delivered by vaginal delivery 07/28/2020  . Status post vacuum-assisted vaginal delivery 12/06/2017  . Post-dates pregnancy 12/02/2017  . SAB (spontaneous abortion) 01/18/2017    Past Surgical History:  Procedure Laterality Date  . NO PAST SURGERIES       OB History    Gravida  3   Para  2   Term  2   Preterm      AB  1   Living  2     SAB  1   TAB      Ectopic      Multiple  0   Live Births  2           Family History  Problem Relation Age of Onset  . Cervical cancer Mother     Social History   Tobacco Use  . Smoking status: Never Smoker  . Smokeless tobacco: Never Used  Vaping Use  . Vaping Use: Never used  Substance Use Topics  . Alcohol use: No  . Drug use: No    Home Medications Prior to Admission medications   Medication Sig Start Date End Date Taking? Authorizing Provider  acetaminophen (TYLENOL) 500 MG tablet Take 2 tablets (1,000 mg total) by mouth every 4 (four) hours as needed (for pain scale < 4). 07/29/20   Alric Seton, MD  ibuprofen (ADVIL) 600 MG tablet Take 1 tablet (600 mg total) by mouth every 6 (six) hours. 07/29/20   Alric Seton, MD  ibuprofen (ADVIL,MOTRIN) 600 MG tablet Take 1 tablet (600 mg total) by mouth every 6 (six) hours. Patient not taking: Reported on 01/22/2020 12/05/17   Marthenia Rolling, DO  norethindrone (ORTHO MICRONOR) 0.35 MG tablet Take 1 tablet (0.35 mg total) by mouth daily. 07/29/20 07/29/21  Alric Seton, MD  predniSONE (STERAPRED UNI-PAK 21 TAB) 10 MG (21) TBPK tablet Take by mouth daily. Take 6 tabs by mouth daily  for 1 day, then 5 tabs for 1 day, then 4 tabs for 1 day, then 3 tabs for 1 day, 2 tabs for 1 day, then 1 tab by mouth daily for 1 day 10/31/20   Cristina Gong, PA-C  Prenatal Vit-Fe Fumarate-FA (PRENATAL MULTIVITAMIN) TABS tablet Take 1 tablet by mouth daily at 12 noon.    [provider]    Allergies    Patient has no known allergies.  Review of Systems   Review of Systems  Constitutional: Negative for chills, fatigue and fever.  Musculoskeletal: Positive for joint swelling.       Pain and swelling of right elbow.   Skin:       Skin around the right elbow is minimally erythematous.  There is no skin tear  Neurological: Negative for weakness and headaches.  Psychiatric/Behavioral: Negative for confusion.  All other systems reviewed and are negative.   Physical Exam Updated Vital Signs BP 140/82 (BP Location: Left Arm)   Pulse 88   Temp 98 F (36.7 C) (Oral)   Resp 15   Ht 5\' 5"  (1.651 m)   SpO2 99%   BMI 31.17 kg/m   Physical Exam Vitals and nursing note reviewed.  Constitutional:      General: She is not in acute distress.    Appearance: She is not ill-appearing.  HENT:     Head: Normocephalic.  Cardiovascular:     Rate and Rhythm: Normal rate.     Pulses: Normal pulses.     Comments: 2+ right radial pulse.  Right hand is warm and well-perfused. Pulmonary:     Effort: Pulmonary effort is normal. No respiratory  distress.  Musculoskeletal:     Comments: Patient limits range of motion secondary to pain of the right elbow.  The right elbow is not significantly swollen.  There is tenderness to palpation superficially along the posterior/radial aspect which recreates and exacerbates her reported pain.  No crepitus or deformity.  Skin:    Comments: Questionable erythema around the right elbow mostly along the posterior aspect however not directly over the olecranon.  No obvious wounds, skin breaks or tears visualized over the right elbow  Neurological:     Mental Status: She is alert.     Comments: Sensation intact to light touch of right hand.  AROM of fingers on right hand.      ED Results / Procedures / Treatments   Labs (all labs ordered are listed, but only abnormal results are displayed) Labs Reviewed - No data to display  EKG None  Radiology DG Elbow Complete Right  Result Date: 10/31/2020 CLINICAL DATA:  Lateral elbow pain EXAM: RIGHT ELBOW - COMPLETE 3+ VIEW COMPARISON:  None. FINDINGS: No significant elbow effusion. No acute displaced fracture is seen. There are multiple soft tissue calcifications adjacent to the lateral epicondyle. Mild soft tissue swelling without associated soft tissue mass. IMPRESSION: 1. No acute osseous abnormality. 2. Soft tissue calcifications adjacent to the lateral epicondyle, question calcific tendinitis. Nonemergent MRI evaluation could be obtained as clinically indicated. Electronically Signed   By: 13/11/2020 M.D.   On: 10/31/2020 17:57    Procedures Procedures (including critical care time)  Medications Ordered in ED Medications  predniSONE (DELTASONE) tablet 60 mg (60 mg Oral Given 10/31/20 1934)  ibuprofen (ADVIL) tablet 600 mg (600 mg Oral Given 10/31/20 1934)    ED Course  I have reviewed the triage vital signs and the nursing notes.  Pertinent labs & imaging results that were available during my care of the patient were reviewed by me and  considered in my medical decision making (see chart for details).    MDM Rules/Calculators/A&P                         Patient is a otherwise healthy 30 year old woman who presents today for evaluation of pain in her elbow for 2 days.  Here she is afebrile, not tachycardic or tachypneic, does not meet SIRS criteria.  She denies any recent trauma, injuries, or wounds that would cause infection.  No history of IV drug abuse/use or recent injections or skin breaks in the right arm.  Patient is currently a breast-feeding, has a young child at home.  She reports that she has been using a manual pump for the past few months.  Based on history, x-ray without joint effusion do not suspect bacterial infection.  She does not have any skin breaks or tears that would be an entrance source for bacteria.  She is otherwise well.  Suspect a tendinitis/overuse injury especially in the setting of having a young child at home.  And a toddler.  Recommended conservative care, RICE, OTC NSAIDs.  Will give a prednisone burst to help decrease inflammation and recommended follow-up with orthopedics.  Low suspicion for gout or septic arthritis.    This patient was seen as a shared visit with Dr. Silverio Lay.   Return precautions were discussed with patient who states their understanding.  At the time of discharge patient denied any unaddressed complaints or concerns.  Patient is agreeable for discharge home.  Note: Portions of this report may have been transcribed using voice recognition software. Every effort was made to ensure accuracy; however, inadvertent computerized transcription errors may be present  Final Clinical Impression(s) / ED Diagnoses Final diagnoses:  Right elbow pain    Rx / DC Orders ED Discharge Orders         Ordered    predniSONE (STERAPRED UNI-PAK 21 TAB) 10 MG (21) TBPK tablet  Daily        10/31/20 1924           Norman Clay 10/31/20 2009    Charlynne Pander, MD 11/01/20  [phone]

## 2020-10-31 NOTE — Discharge Instructions (Addendum)
Please take Ibuprofen (Advil, motrin) and Tylenol (acetaminophen) to relieve your pain.  You may take up to 600 MG (3 pills) of normal strength ibuprofen every 8 hours as needed.  In between doses of ibuprofen you make take tylenol, up to 1,000 mg (two extra strength pills).  Do not take more than 3,000 mg tylenol in a 24 hour period.  Please check all medication labels as many medications such as pain and cold medications may contain tylenol.  Do not drink alcohol while taking these medications.  Do not take other NSAID'S while taking ibuprofen (such as aleve or naproxen).  Please take ibuprofen with food to decrease stomach upset.  I have given you a prescription for steroids today.  Some common side effects include feelings of extra energy, feeling warm, increased appetite, and stomach upset.  If you are diabetic your sugars may run higher than usual.   Start taking the steroids tomorrow.   If you develop fevers, or have other concerns please seek additional medical care and evaluation.

## 2020-10-31 NOTE — ED Triage Notes (Signed)
Pt endorses right elbow pain since Wednesday. Denies any trauma. Has been icing it.

## 2021-07-16 ENCOUNTER — Inpatient Hospital Stay (HOSPITAL_COMMUNITY): Payer: Medicaid Other

## 2021-07-16 ENCOUNTER — Encounter (HOSPITAL_COMMUNITY): Payer: Self-pay | Admitting: *Deleted

## 2021-07-16 ENCOUNTER — Inpatient Hospital Stay (HOSPITAL_COMMUNITY)
Admission: EM | Admit: 2021-07-16 | Discharge: 2021-07-16 | Disposition: A | Discharge status: Home / Self Care | Payer: Medicaid Other | Attending: Obstetrics and Gynecology | Admitting: Obstetrics and Gynecology

## 2021-07-16 ENCOUNTER — Other Ambulatory Visit: Payer: Self-pay

## 2021-07-16 DIAGNOSIS — O4690 Antepartum hemorrhage, unspecified, unspecified trimester: Secondary | ICD-10-CM

## 2021-07-16 DIAGNOSIS — O034 Incomplete spontaneous abortion without complication: Secondary | ICD-10-CM | POA: Diagnosis not present

## 2021-07-16 DIAGNOSIS — O209 Hemorrhage in early pregnancy, unspecified: Secondary | ICD-10-CM | POA: Diagnosis present

## 2021-07-16 DIAGNOSIS — Z6790 Unspecified blood type, Rh positive: Secondary | ICD-10-CM

## 2021-07-16 DIAGNOSIS — Z3A08 8 weeks gestation of pregnancy: Secondary | ICD-10-CM | POA: Insufficient documentation

## 2021-07-16 LAB — URINALYSIS, ROUTINE W REFLEX MICROSCOPIC
Bilirubin Urine: NEGATIVE
Glucose, UA: NEGATIVE mg/dL
Ketones, ur: NEGATIVE mg/dL
Leukocytes,Ua: NEGATIVE
Nitrite: NEGATIVE
Protein, ur: NEGATIVE mg/dL
Specific Gravity, Urine: 1.004 — ABNORMAL LOW (ref 1.005–1.030)
pH: 6 (ref 5.0–8.0)

## 2021-07-16 LAB — CBC
HCT: 41.5 % (ref 36.0–46.0)
Hemoglobin: 13.7 g/dL (ref 12.0–15.0)
MCH: 29.8 pg (ref 26.0–34.0)
MCHC: 33 g/dL (ref 30.0–36.0)
MCV: 90.2 fL (ref 80.0–100.0)
Platelets: 253 10*3/uL (ref 150–400)
RBC: 4.6 MIL/uL (ref 3.87–5.11)
RDW: 14.2 % (ref 11.5–15.5)
WBC: 9.8 10*3/uL (ref 4.0–10.5)
nRBC: 0 % (ref 0.0–0.2)

## 2021-07-16 LAB — WET PREP, GENITAL
Clue Cells Wet Prep HPF POC: NONE SEEN
Sperm: NONE SEEN
Trich, Wet Prep: NONE SEEN
Yeast Wet Prep HPF POC: NONE SEEN

## 2021-07-16 LAB — HCG, QUANTITATIVE, PREGNANCY: hCG, Beta Chain, Quant, S: 9754 m[IU]/mL — ABNORMAL HIGH (ref <5)

## 2021-07-16 LAB — PREGNANCY, URINE: Preg Test, Ur: POSITIVE — AB

## 2021-07-16 NOTE — MAU Provider Note (Signed)
History     CSN: [id number]  Arrival date and time: 07/16/21 1147   Event Date/Time   First Provider Initiated Contact with Patient 07/16/21 1517      Chief Complaint  Patient presents with   Vaginal Bleeding   Abdominal Pain   29 y.o. [id number] @[redacted]w[redacted]d  by LMP presenting with cramping and VB. States she had a small amt last week and then again today. She stained most of a panty liner. Reports intermittent low abdominal cramps. Rates pain 1/10. Denies urinary sx. Denies vaginal discharge.   OB History     Gravida  4   Para  2   Term  2   Preterm      AB  1   Living  2      SAB  1   IAB      Ectopic      Multiple  0   Live Births  2           Past Medical History:  Diagnosis Date   Medical history non-contributory     Past Surgical History:  Procedure Laterality Date   NO PAST SURGERIES      Family History  Problem Relation Age of Onset   Cervical cancer Mother     Social History   Tobacco Use   Smoking status: Never   Smokeless tobacco: Never  Vaping Use   Vaping Use: Never used  Substance Use Topics   Alcohol use: No   Drug use: No    Allergies: No Known Allergies  Medications Prior to Admission  Medication Sig Dispense Refill Last Dose   acetaminophen (TYLENOL) 500 MG tablet Take 2 tablets (1,000 mg total) by mouth every 4 (four) hours as needed (for pain scale < 4). 30 tablet 0    ibuprofen (ADVIL) 600 MG tablet Take 1 tablet (600 mg total) by mouth every 6 (six) hours. 30 tablet 0    ibuprofen (ADVIL,MOTRIN) 600 MG tablet Take 1 tablet (600 mg total) by mouth every 6 (six) hours. (Patient not taking: Reported on 01/22/2020) 60 tablet 0    norethindrone (ORTHO MICRONOR) 0.35 MG tablet Take 1 tablet (0.35 mg total) by mouth daily. 28 tablet 11    predniSONE (STERAPRED UNI-PAK 21 TAB) 10 MG (21) TBPK tablet Take by mouth daily. Take 6 tabs by mouth daily  for 1 day, then 5 tabs for 1 day, then 4 tabs for 1 day, then 3 tabs for 1 day, 2  tabs for 1 day, then 1 tab by mouth daily for 1 day 21 tablet 0    Prenatal Vit-Fe Fumarate-FA (PRENATAL MULTIVITAMIN) TABS tablet Take 1 tablet by mouth daily at 12 noon.       Review of Systems  Gastrointestinal:  Positive for abdominal pain.  Genitourinary:  Positive for vaginal bleeding. Negative for dysuria, frequency, urgency and vaginal discharge.  Physical Exam   Blood pressure 121/68, pulse 70, temperature 98.5 F (36.9 C), temperature source Oral, resp. rate 16, height 5\' 4"  (1.626 m), weight 67 kg, last menstrual period 05/15/2021, SpO2 100 %, unknown if currently breastfeeding.  Physical Exam Vitals and nursing note reviewed.  Constitutional:      Appearance: Normal appearance.  HENT:     Head: Normocephalic and atraumatic.  Cardiovascular:     Rate and Rhythm: Normal rate.  Pulmonary:     Effort: Pulmonary effort is normal. No respiratory distress.  Musculoskeletal:        General: Normal range of motion.  Cervical back: Normal range of motion.  Neurological:     General: No focal deficit present.     Mental Status: She is alert and oriented to person, place, and time.  Psychiatric:        Mood and Affect: Mood normal.        Behavior: Behavior normal.   Results for orders placed or performed during the hospital encounter of 07/16/21 (from the past 24 hour(s))  Pregnancy, urine     Status: Abnormal   Collection Time: 07/16/21  1:35 PM  Result Value Ref Range   Preg Test, Ur POSITIVE (A) NEGATIVE  CBC     Status: None   Collection Time: 07/16/21  3:13 PM  Result Value Ref Range   WBC 9.8 4.0 - 10.5 K/uL   RBC 4.60 3.87 - 5.11 MIL/uL   Hemoglobin 13.7 12.0 - 15.0 g/dL   HCT 92.4 46.2 - 86.3 %   MCV 90.2 80.0 - 100.0 fL   MCH 29.8 26.0 - 34.0 pg   MCHC 33.0 30.0 - 36.0 g/dL   RDW 81.7 71.1 - 65.7 %   Platelets 253 150 - 400 K/uL   nRBC 0.0 0.0 - 0.2 %  hCG, quantitative, pregnancy     Status: Abnormal   Collection Time: 07/16/21  3:13 PM  Result Value  Ref Range   hCG, Beta Chain, Quant, S 9,754 (H) <5 mIU/mL  Urinalysis, Routine w reflex microscopic PATH Cytology Cervicovaginal Ancillary Only     Status: Abnormal   Collection Time: 07/16/21  3:29 PM  Result Value Ref Range   Color, Urine STRAW (A) YELLOW   APPearance CLEAR CLEAR   Specific Gravity, Urine 1.004 (L) 1.005 - 1.030   pH 6.0 5.0 - 8.0   Glucose, UA NEGATIVE NEGATIVE mg/dL   Hgb urine dipstick LARGE (A) NEGATIVE   Bilirubin Urine NEGATIVE NEGATIVE   Ketones, ur NEGATIVE NEGATIVE mg/dL   Protein, ur NEGATIVE NEGATIVE mg/dL   Nitrite NEGATIVE NEGATIVE   Leukocytes,Ua NEGATIVE NEGATIVE   RBC / HPF 0-5 0 - 5 RBC/hpf   WBC, UA 0-5 0 - 5 WBC/hpf   Bacteria, UA RARE (A) NONE SEEN   Squamous Epithelial / LPF 0-5 0 - 5   Mucus PRESENT   Wet prep, genital     Status: Abnormal   Collection Time: 07/16/21  4:02 PM   Specimen: PATH Cytology Cervicovaginal Ancillary Only  Result Value Ref Range   Yeast Wet Prep HPF POC NONE SEEN NONE SEEN   Trich, Wet Prep NONE SEEN NONE SEEN   Clue Cells Wet Prep HPF POC NONE SEEN NONE SEEN   WBC, Wet Prep HPF POC FEW (A) NONE SEEN   Sperm NONE SEEN    US OB LESS THAN 14 WEEKS WITH OB TRANSVAGINAL  Result Date: 07/16/2021 CLINICAL DATA:  Spotting for 1 week in a 30 year old female with last menstrual period of 05/15/2021 found to be pregnant, quantitative beta HCG currently pending. Gestational age by last menstrual period 8 weeks 6 days. EXAM: OBSTETRIC <14 WK [place] AND TRANSVAGINAL OB US TECHNIQUE: Both transabdominal and transvaginal ultrasound examinations were performed for complete evaluation of the gestation as well as the maternal uterus, adnexal regions, and pelvic cul-de-sac. Transvaginal technique was performed to assess early pregnancy. COMPARISON:  None FINDINGS: Intrauterine gestational sac: Single Yolk sac:  Not visualized Embryo:  Visualized Cardiac Activity: Not visualized CRL: 12.8 mm   7 w   3 d  [place] EDC: 03/01/2022  Subchorionic hemorrhage:  None visualized. Maternal uterus/adnexae: Trace free fluid in the pelvis. Involuting corpus luteum of the LEFT ovary. IMPRESSION: Findings meet definitive criteria for failed pregnancy. Gestational sac in the uterine fundus with crown-rump length of greater than 7 mm at 12.8 mm with no cardiac activity. This follows SRU consensus guidelines: Diagnostic Criteria for Nonviable Pregnancy Early in the First Trimester. [facility] [phone]. Electronically Signed   By: Donzetta Kohut M.D.   On: 07/16/2021 16:22    MAU Course  Procedures  MDM Labs and [place] ordered and reviewed. Failed pregnancy identified on [place]. Discussed findings with pt, condolences given. Offered expectant mngt vs medical mngt, pt prefers expectant at this time. Will f/u in office in 1 week. Stable for discharge home.   Assessment and Plan   1. Abortion, inevitable   2. Vaginal bleeding in pregnancy   3. Blood type, Rh positive    Discharge home Follow up at [facility] in 1 week- message sent Return precautions  Allergies as of 07/16/2021   No Known Allergies      Medication List     STOP taking these medications    ibuprofen 600 MG tablet Commonly known as: ADVIL   norethindrone 0.35 MG tablet Commonly known as: Ortho Micronor   predniSONE 10 MG (21) Tbpk tablet Commonly known as: STERAPRED UNI-PAK 21 TAB       TAKE these medications    acetaminophen 500 MG tablet Commonly known as: TYLENOL Take 2 tablets (1,000 mg total) by mouth every 4 (four) hours as needed (for pain scale < 4).   prenatal multivitamin Tabs tablet Take 1 tablet by mouth daily at 12 noon.       Donette Larry, CNM 07/16/2021, 5:17 PM

## 2021-07-16 NOTE — ED Provider Notes (Signed)
Emergency Medicine Provider OB Triage Evaluation Note  Kathleen Cain is a 30 y.o. female, [id number], at Unknown gestation who presents to the emergency department with complaints of patient of vaginal bleeding and pelvic cramping, started last night, states she has intermittent cramping, she is [redacted] weeks pregnant, states she had 2 positive home pregnancy test, went to the [facility], where she had a positive pregnancy test, she has had no imaging done on this pregnancy yet.  Has not seen an OB/GYN.  She has had 4 previous pregnancies prior to this.  She denies recent trauma, no abdominal pain, nausea vomiting diarrhea, last menstrual cycle was May 22..  Review of  Systems  Positive: Vaginal bleeding, pelvic pain Negative: Nausea, vomiting  Physical Exam  BP 112/66 (BP Location: Right Arm)   Pulse 65   Temp 98.9 F (37.2 C) (Oral)   Resp 20   LMP 05/15/2021 (Approximate)   SpO2 100%  General: Awake, no distress  HEENT: Atraumatic  Resp: Normal effort  Cardiac: Normal rate Abd: Nondistended, nontender  MSK: Moves all extremities without difficulty Neuro: Speech clear  Medical Decision Making  Pt evaluated for pregnancy concern and is stable for transfer to MAU. Pt is in agreement with plan for transfer.  1:29 PM Discussed with MAU APP, sam , who accepts patient in transfer.  Clinical Impression  No diagnosis found.     Carroll Sage, PA-C 07/16/21 1426    Alvira Monday, MD 07/16/21 [phone]

## 2021-07-16 NOTE — ED Notes (Signed)
Called report to MAU and called transport

## 2021-07-16 NOTE — MAU Note (Signed)
Kathleen Cain is a 30 y.o. at [redacted]w[redacted]d here in MAU reporting: last week was spotting, now bleeding is a little bit heavier. Is wearing a pad and has changed it 1 time. States she sees the bleeding when using the bathroom. Is having some cramping, it is intermittent.   LMP: 05/15/2021  Onset of complaint: ongoing  Pain score: 0/10  Vitals:   07/16/21 1348 07/16/21 1513  BP: 105/76 121/68  Pulse: 71 70  Resp: 20 16  Temp: 98.4 F (36.9 C) 98.5 F (36.9 C)  SpO2: 100% 100%     Lab orders placed from triage: entered by provider

## 2021-07-16 NOTE — MAU Note (Signed)
Blind swabs collected by pt in the bathroom

## 2021-07-16 NOTE — ED Triage Notes (Signed)
Pt reports having mild vaginal bleeding a few days ago. No having increase in amount since yesterday and it is now in toilet and light red and mild cramping. Patient recently had + preg test, reports being approx [redacted] weeks pregnant, 4th pregnancy.

## 2021-07-17 LAB — GC/CHLAMYDIA PROBE AMP (~~LOC~~) NOT AT ARMC
Chlamydia: NEGATIVE
Comment: NEGATIVE
Comment: NORMAL
Neisseria Gonorrhea: NEGATIVE

## 2021-07-18 ENCOUNTER — Inpatient Hospital Stay (HOSPITAL_COMMUNITY)
Admission: AD | Admit: 2021-07-18 | Discharge: 2021-07-18 | Disposition: A | Discharge status: Home / Self Care | Payer: Medicaid Other | Attending: Obstetrics & Gynecology | Admitting: Obstetrics & Gynecology

## 2021-07-18 ENCOUNTER — Other Ambulatory Visit: Payer: Self-pay

## 2021-07-18 DIAGNOSIS — O039 Complete or unspecified spontaneous abortion without complication: Secondary | ICD-10-CM | POA: Diagnosis not present

## 2021-07-18 NOTE — MAU Note (Signed)
Kathleen Cain is a 30 y.o. at [redacted]w[redacted]d here in MAU reporting: states she thinks she passed the pregnancy today and would like to make sure everything is out. Having some light bleeding. No pain.  Onset of complaint: today  Pain score: 0/10  Vitals:   07/18/21 1626  BP: 111/68  Pulse: 79  Resp: 16  Temp: 98.5 F (36.9 C)  SpO2: 100%     Lab orders placed from triage: none

## 2021-07-18 NOTE — MAU Provider Note (Signed)
None    S Ms. Kathleen Cain is a 30 y.o. [phone] patient who presents to MAU today stating that she was told that on Thursday she was having a miscarriage. She comes to MAU today reporting that at 3pm she had a small cramp and felt like something was coming out. She went to the bathroom and saw what she thought was the baby and she just wants to make sure she has passed the baby. She did bring the specimen with her today. She denies pain and reports very minimal bleeding.  O BP 111/68 (BP Location: Right Arm)   Pulse 79   Temp 98.5 F (36.9 C) (Oral)   Resp 16   LMP 05/15/2021 (Approximate)   SpO2 100% Comment: room air Physical Exam Cardiovascular:     Rate and Rhythm: Normal rate.  Pulmonary:     Effort: Pulmonary effort is normal.  Abdominal:     General: Abdomen is flat.     Palpations: Abdomen is soft.     Tenderness: There is no abdominal tenderness.  Genitourinary:    Comments: Minimal bleeding on pad  Skin:    General: Skin is warm.  Neurological:     General: No focal deficit present.     Mental Status: She is alert.  Psychiatric:        Mood and Affect: Mood normal.        Behavior: Behavior normal.   Specimen: appears to be an intact sac and fetus present   A Medical screening exam complete SAB  P Specimen sent to pathology per patient request Discharge from MAU in stable condition Warning signs for worsening condition that would warrant emergency follow-up discussed Patient may return to MAU as needed  Patient to follow up at [facility] next week as previously scheduled    Camelia Eng, MSN, CNM 07/18/2021 5:29 PM

## 2021-07-21 LAB — SURGICAL PATHOLOGY

## 2021-08-04 ENCOUNTER — Other Ambulatory Visit: Payer: Self-pay

## 2021-08-04 ENCOUNTER — Encounter: Payer: Self-pay | Admitting: Family Medicine

## 2021-08-04 ENCOUNTER — Other Ambulatory Visit (HOSPITAL_COMMUNITY)
Admission: RE | Admit: 2021-08-04 | Discharge: 2021-08-04 | Disposition: A | Discharge status: Home / Self Care | Payer: Medicaid Other | Source: Ambulatory Visit | Attending: Family Medicine | Admitting: Family Medicine

## 2021-08-04 ENCOUNTER — Ambulatory Visit (INDEPENDENT_AMBULATORY_CARE_PROVIDER_SITE_OTHER): Payer: Medicaid Other | Admitting: Family Medicine

## 2021-08-04 VITALS — BP 121/80 | Ht 64.0 in | Wt 147.5 lb

## 2021-08-04 DIAGNOSIS — Z01419 Encounter for gynecological examination (general) (routine) without abnormal findings: Secondary | ICD-10-CM | POA: Diagnosis not present

## 2021-08-04 DIAGNOSIS — O039 Complete or unspecified spontaneous abortion without complication: Secondary | ICD-10-CM | POA: Diagnosis not present

## 2021-08-04 DIAGNOSIS — Z124 Encounter for screening for malignant neoplasm of cervix: Secondary | ICD-10-CM | POA: Diagnosis not present

## 2021-08-04 NOTE — Progress Notes (Signed)
GYNECOLOGY OFFICE VISIT NOTE  History:   Kathleen Cain is a 30 y.o. [id number] here today for SAB follow up.  Diagnosed with definitive failed pregnancy in 07/16/2021 in MAU Passed pregnancy and brought to MAU on 07/18/21, pathology c/w products of conception Blood type A+ on multiple samples per chart review  Today reports bleeding for about a week after miscarriage but none currently No abdominal pain Desires to get pregnant again soon Concerned that she ate a lot of garlic and that may have contributed to her miscarriage  Health Maintenance Due  Topic Date Due   TETANUS/TDAP  Never done   PAP-Cervical Cytology Screening  Never done   PAP SMEAR-Modifier  Never done   INFLUENZA VACCINE  07/20/2021    Past Medical History:  Diagnosis Date   Medical history non-contributory     Past Surgical History:  Procedure Laterality Date   NO PAST SURGERIES      The following portions of the patient's history were reviewed and updated as appropriate: allergies, current medications, past family history, past medical history, past social history, past surgical history and problem list.   Health Maintenance:   Last pap: No results found for: DIAGPAP, HPV, HPVHIGH  Collected today  Last mammogram:  N/a    Review of Systems:  Pertinent items noted in HPI and remainder of comprehensive ROS otherwise negative.  Physical Exam:  BP 121/80   Ht 5\' 4"  (1.626 m)   Wt 147 lb 8 oz (66.9 kg)   LMP 05/15/2021 (Approximate)   Breastfeeding Unknown   BMI 25.32 kg/m  CONSTITUTIONAL: Well-developed, well-nourished female in no acute distress.  HEENT:  Normocephalic, atraumatic. External right and left ear normal. No scleral icterus.  NECK: Normal range of motion, supple, no masses noted on observation SKIN: No rash noted. Not diaphoretic. No erythema. No pallor. MUSCULOSKELETAL: Normal range of motion. No edema noted. NEUROLOGIC: Alert and oriented to person, place, and time. Normal  muscle tone coordination.  PSYCHIATRIC: Normal mood and affect. Normal behavior. Normal judgment and thought content. RESPIRATORY: Effort normal, no problems with respiration noted ABDOMEN: No masses noted. No other overt distention noted.   PELVIC: Normal appearing external genitalia; normal appearing vaginal mucosa and cervix.  No abnormal discharge noted.    Labs and Imaging No results found for this or any previous visit (from the past 168 hour(s)). 05/17/2021 OB LESS THAN 14 WEEKS WITH OB TRANSVAGINAL  Result Date: 07/16/2021 CLINICAL DATA:  Spotting for 1 week in a 30 year old female with last menstrual period of 05/15/2021 found to be pregnant, quantitative beta HCG currently pending. Gestational age by last menstrual period 8 weeks 6 days. EXAM: OBSTETRIC <14 WK 05/17/2021 AND TRANSVAGINAL OB US TECHNIQUE: Both transabdominal and transvaginal ultrasound examinations were performed for complete evaluation of the gestation as well as the maternal uterus, adnexal regions, and pelvic cul-de-sac. Transvaginal technique was performed to assess early pregnancy. COMPARISON:  None FINDINGS: Intrauterine gestational sac: Single Yolk sac:  Not visualized Embryo:  Visualized Cardiac Activity: Not visualized CRL: 12.8 mm   7 w   3 d                  [place] EDC: 03/01/2022 Subchorionic hemorrhage:  None visualized. Maternal uterus/adnexae: Trace free fluid in the pelvis. Involuting corpus luteum of the LEFT ovary. IMPRESSION: Findings meet definitive criteria for failed pregnancy. Gestational sac in the uterine fundus with crown-rump length of greater than 7 mm at 12.8 mm with no cardiac activity. This follows  SRU consensus guidelines: Diagnostic Criteria for Nonviable Pregnancy Early in the First Trimester. [facility] [phone]. Electronically Signed   By: Donzetta Kohut M.D.   On: 07/16/2021 16:22      Assessment and Plan:   Problem List Items Addressed This Visit       Other   SAB (spontaneous abortion) - Primary     Completed SAB. Reassured she did not do (ie eat a lot of garlic) or not do anything to cause this as miscarriage is very common. Same partner as other previous pregnancies with live deliveries. Discussed most common cause is presumed to be genetic abnormalities that do not allow the pregnancy to continue. Studies do not show benefit or harm to a certain waiting period for next conception. Follow up PRN.  Due for pap, collected today.       Other Visit Diagnoses     Screening for cervical cancer       Well woman exam with routine gynecological exam       Relevant Orders   Cytology - PAP( [place])       Routine preventative health maintenance measures emphasized. Please refer to After Visit Summary for other counseling recommendations.   Return if symptoms worsen or fail to improve.    Total face-to-face time with patient: 15 minutes.  Over 50% of encounter was spent on counseling and coordination of care.   Venora Maples, MD/MPH Attending Family Medicine Physician, [facility] for [facility], [facility]

## 2021-08-04 NOTE — Assessment & Plan Note (Signed)
Completed SAB. Reassured she did not do (ie eat a lot of garlic) or not do anything to cause this as miscarriage is very common. Same partner as other previous pregnancies with live deliveries. Discussed most common cause is presumed to be genetic abnormalities that do not allow the pregnancy to continue. Studies do not show benefit or harm to a certain waiting period for next conception. Follow up PRN.  Due for pap, collected today.

## 2021-08-04 NOTE — Progress Notes (Signed)
Patient in today for follow up after SAB. Seen in MAU on 7/28 and 7/30. Reports bleeding x1 week following dx. Has not had bleeding since. Denies any pain, states she did have mucous clear discharge without odor for the last couple of days. Would like to try for pregnancy again.  Wynona Canes, CMA

## 2021-08-06 LAB — CYTOLOGY - PAP: Diagnosis: NEGATIVE

## 2021-12-20 NOTE — L&D Delivery Note (Signed)
OB/GYN Faculty Practice Delivery Note  Kathleen Cain is a 31 y.o. [id number] s/p SVD at [redacted]w[redacted]d. She was admitted for IOL for LGA.   ROM: 2h 58m with clear fluid GBS Status: neg Maximum Maternal Temperature: 98.7  Labor Progress: Progressed well from 2 cm to complete without augmentation, only received 50/25 of cytotec for induction.   Delivery Date/Time: 10/18/22, 0540 Delivery: Called to room and patient was complete and pushing. Head delivered ROA. Loose nuchal cord identified, delivered through. Shoulder and body delivered in usual fashion with upward traction on anterior axilla. Infant with spontaneous cry, placed on mother's abdomen, dried and stimulated. Cord clamped x 2 after 1-minute delay, and cut by CNM. Cord blood drawn. Placenta delivered spontaneously with gentle cord traction. Fundus firm with massage and Pitocin. Labia, perineum, vagina, and cervix were inspected. First degree perineal laceration with small amount of bleeding identified, repaired with 3.0 vicryl in running fashion.    Placenta: complete, three vessel cord appreciated Complications: none Lacerations: first degree perineal, repaired EBL: 163 mL Analgesia: epidural (placed within 30 minutes of second stage of labor)  Postpartum Planning [ ]  message to sent to schedule follow-up  [ ]  vaccines UTD  Infant: female  APGARs 8/9  weight pending  Cecilio Asper, MD Center for [employer], [place]

## 2022-02-23 ENCOUNTER — Encounter (HOSPITAL_COMMUNITY): Payer: Self-pay

## 2022-02-23 ENCOUNTER — Ambulatory Visit (HOSPITAL_COMMUNITY)
Admission: EM | Admit: 2022-02-23 | Discharge: 2022-02-23 | Disposition: A | Discharge status: Home / Self Care | Payer: Medicaid Other | Attending: Student | Admitting: Student

## 2022-02-23 ENCOUNTER — Other Ambulatory Visit: Payer: Self-pay

## 2022-02-23 DIAGNOSIS — Z3A01 Less than 8 weeks gestation of pregnancy: Secondary | ICD-10-CM | POA: Diagnosis not present

## 2022-02-23 DIAGNOSIS — O99830 Other infection carrier state complicating pregnancy: Secondary | ICD-10-CM | POA: Diagnosis not present

## 2022-02-23 DIAGNOSIS — H66001 Acute suppurative otitis media without spontaneous rupture of ear drum, right ear: Secondary | ICD-10-CM | POA: Diagnosis not present

## 2022-02-23 MED ORDER — AMOXICILLIN 875 MG PO TABS: 875.0000 mg | ORAL_TABLET | Freq: Two times a day (BID) | ORAL | 0 refills | Status: AC

## 2022-02-23 MED ORDER — TRIAMCINOLONE ACETONIDE 55 MCG/ACT NA AERO: 2.0000 | INHALATION_SPRAY | Freq: Every day | NASAL | 1 refills | Status: DC

## 2022-02-23 NOTE — Discharge Instructions (Addendum)
-  Amoxicillin twice daily x7 days ?-Nasacort nasal spray once daily for at least 7 days, continue for longer if it's helping. ?-You can also try an over-the-counter allergy medication like claritin daily ?-Follow-up if symptoms worsen/persist ?

## 2022-02-23 NOTE — ED Provider Notes (Signed)
?[facility] ? ? ? ?CSN: [id number] ?Arrival date & time: 02/23/22  1434 ? ? ?  ? ?History   ?Chief Complaint ?Chief Complaint  ?Patient presents with  ? Otalgia  ?  right  ? ? ?HPI ?Kathleen Cain is a 31 y.o. female presenting with right ear pain, muffled hearing, feeling clogged for 3 to 4 days.  She is currently [redacted] weeks pregnant.  Does endorse allergic rhinitis that is currently unmedicated.  Also with some nasal congestion.  Denies dizziness, tinnitus.  Has attempted putting oil in her ear without relief. ? ?HPI ? ?Past Medical History:  ?Diagnosis Date  ? Medical history non-contributory   ? ? ?Patient Active Problem List  ? Diagnosis Date Noted  ? Premature rupture of membranes 07/28/2020  ? Single liveborn, born in hospital, delivered by vaginal delivery 07/28/2020  ? Status post vacuum-assisted vaginal delivery 12/06/2017  ? Post-dates pregnancy 12/02/2017  ? SAB (spontaneous abortion) 01/18/2017  ? ? ?Past Surgical History:  ?Procedure Laterality Date  ? NO PAST SURGERIES    ? ? ?OB History   ? ? Gravida  ?5  ? Para  ?2  ? Term  ?2  ? Preterm  ?   ? AB  ?1  ? Living  ?2  ?  ? ? SAB  ?1  ? IAB  ?   ? Ectopic  ?   ? Multiple  ?0  ? Live Births  ?2  ?   ?  ?  ? ? ? ?Home Medications   ? ?Prior to Admission medications   ?Medication Sig Start Date End Date Taking? Authorizing Provider  ?amoxicillin (AMOXIL) 875 MG tablet Take 1 tablet (875 mg total) by mouth 2 (two) times daily for 7 days. 02/23/22 03/02/22 Yes Rhys Martini, PA-C  ?triamcinolone (NASACORT) 55 MCG/ACT AERO nasal inhaler Place 2 sprays into the nose daily. 02/23/22  Yes Rhys Martini, PA-C  ?Prenatal Vit-Fe Fumarate-FA (PRENATAL MULTIVITAMIN) TABS tablet Take 1 tablet by mouth daily at 12 noon.    [provider]  ? ? ?Family History ?Family History  ?Problem Relation Age of Onset  ? Cervical cancer Mother   ? ? ?Social History ?Social History  ? ?Tobacco Use  ? Smoking status: Never  ? Smokeless tobacco: Never  ?Vaping Use  ?  Vaping Use: Never used  ?Substance Use Topics  ? Alcohol use: No  ? Drug use: No  ? ? ? ?Allergies   ?Patient has no known allergies. ? ? ?Review of Systems ?Review of Systems  ?HENT:  Positive for ear pain.   ?All other systems reviewed and are negative. ? ? ?Physical Exam ?Triage Vital Signs ?ED Triage Vitals  ?Enc Vitals Group  ?   BP 02/23/22 1602 108/71  ?   Pulse Rate 02/23/22 1602 70  ?   Resp 02/23/22 1602 20  ?   Temp 02/23/22 1602 98.3 ?F (36.8 ?C)  ?   Temp Source 02/23/22 1602 Oral  ?   SpO2 02/23/22 1602 100 %  ?   Weight --   ?   Height --   ?   Head Circumference --   ?   Peak Flow --   ?   Pain Score 02/23/22 1600 2  ?   Pain Loc --   ?   Pain Edu? --   ?   Excl. in GC? --   ? ?No data found. ? ?Updated Vital Signs ?BP 108/71 (BP Location: Left  Arm)   Pulse 70   Temp 98.3 ?F (36.8 ?C) (Oral)   Resp 20   LMP  (LMP Unknown)   SpO2 100%   Breastfeeding No Comment: Has not seen OB yet ? ?Visual Acuity ?Right Eye Distance:   ?Left Eye Distance:   ?Bilateral Distance:   ? ?Right Eye Near:   ?Left Eye Near:    ?Bilateral Near:    ? ?Physical Exam ?Vitals reviewed.  ?Constitutional:   ?   Appearance: Normal appearance. She is not ill-appearing.  ?HENT:  ?   Head: Normocephalic and atraumatic.  ?   Right Ear: Hearing, ear canal and external ear normal. Tenderness present. No swelling. No middle ear effusion. There is no impacted cerumen. No mastoid tenderness. Tympanic membrane is erythematous and bulging. Tympanic membrane is not injected, scarred, perforated or retracted.  ?   Left Ear: Hearing, tympanic membrane, ear canal and external ear normal. No swelling or tenderness.  No middle ear effusion. There is no impacted cerumen. No mastoid tenderness. Tympanic membrane is not injected, scarred, perforated, erythematous, retracted or bulging.  ?   Mouth/Throat:  ?   Pharynx: Oropharynx is clear. No oropharyngeal exudate or posterior oropharyngeal erythema.  ?Cardiovascular:  ?   Rate and Rhythm: Normal  rate and regular rhythm.  ?   Heart sounds: Normal heart sounds.  ?Pulmonary:  ?   Effort: Pulmonary effort is normal.  ?   Breath sounds: Normal breath sounds.  ?Lymphadenopathy:  ?   Cervical: No cervical adenopathy.  ?Neurological:  ?   General: No focal deficit present.  ?   Mental Status: She is alert and oriented to person, place, and time.  ?Psychiatric:     ?   Mood and Affect: Mood normal.     ?   Behavior: Behavior normal.     ?   Thought Content: Thought content normal.     ?   Judgment: Judgment normal.  ? ? ? ?UC Treatments / Results  ?Labs ?(all labs ordered are listed, but only abnormal results are displayed) ?Labs Reviewed - No data to display ? ?EKG ? ? ?Radiology ?No results found. ? ?Procedures ?Procedures (including critical care time) ? ?Medications Ordered in UC ?Medications - No data to display ? ?Initial Impression / Assessment and Plan / UC Course  ?I have reviewed the triage vital signs and the nursing notes. ? ?Pertinent labs & imaging results that were available during my care of the patient were reviewed by me and considered in my medical decision making (see chart for details). ? ?  ? ?This patient is a very pleasant 31 y.o. year old female presenting with R AOM following untreated allergic rhinitis. Afebrile, nontachy. She is [redacted] weeks pregnant. Will manage with amoxicillin and nasal steroid. Stop putting oil in ear. F/u if symptoms persist.  ? ?Final Clinical Impressions(s) / UC Diagnoses  ? ?Final diagnoses:  ?Non-recurrent acute suppurative otitis media of right ear without spontaneous rupture of tympanic membrane  ?Pregnancy with 5 completed weeks gestation  ? ? ? ?Discharge Instructions   ? ?  ?-Amoxicillin twice daily x7 days ?-Nasacort nasal spray once daily for at least 7 days, continue for longer if it's helping. ?-You can also try an over-the-counter allergy medication like claritin daily ?-Follow-up if symptoms worsen/persist ? ? ?ED Prescriptions   ? ? Medication Sig Dispense  Auth. Provider  ? amoxicillin (AMOXIL) 875 MG tablet Take 1 tablet (875 mg total) by mouth 2 (two) times daily  for 7 days. 14 tablet Rhys Martini, PA-C  ? triamcinolone (NASACORT) 55 MCG/ACT AERO nasal inhaler Place 2 sprays into the nose daily. 1 each Rhys Martini, PA-C  ? ?  ? ?PDMP not reviewed this encounter. ?  ?Rhys Martini, PA-C ?02/23/22 1622 ? ?

## 2022-02-23 NOTE — ED Triage Notes (Signed)
3-4 day h/o right ear pain and feeling as if her ear is clogged. Has tried putting oil in her ear. No left ear sxs. Denies uri sxs. ?

## 2022-05-24 ENCOUNTER — Other Ambulatory Visit: Payer: Self-pay | Admitting: Nurse Practitioner

## 2022-05-24 DIAGNOSIS — Z3689 Encounter for other specified antenatal screening: Secondary | ICD-10-CM

## 2022-05-24 LAB — HEPATITIS C ANTIBODY: HCV Ab: NEGATIVE

## 2022-05-24 LAB — OB RESULTS CONSOLE RUBELLA ANTIBODY, IGM: Rubella: IMMUNE

## 2022-05-24 LAB — OB RESULTS CONSOLE HEPATITIS B SURFACE ANTIGEN: Hepatitis B Surface Ag: NEGATIVE

## 2022-06-11 ENCOUNTER — Encounter: Payer: Self-pay | Admitting: *Deleted

## 2022-06-16 ENCOUNTER — Ambulatory Visit: Payer: Medicaid Other | Attending: Nurse Practitioner

## 2022-06-16 ENCOUNTER — Other Ambulatory Visit: Payer: Self-pay | Admitting: Nurse Practitioner

## 2022-06-16 ENCOUNTER — Other Ambulatory Visit: Payer: Self-pay | Admitting: *Deleted

## 2022-06-16 ENCOUNTER — Ambulatory Visit: Payer: Medicaid Other | Attending: Obstetrics and Gynecology | Admitting: Obstetrics and Gynecology

## 2022-06-16 DIAGNOSIS — O35EXX0 Maternal care for other (suspected) fetal abnormality and damage, fetal genitourinary anomalies, not applicable or unspecified: Secondary | ICD-10-CM

## 2022-06-16 DIAGNOSIS — O283 Abnormal ultrasonic finding on antenatal screening of mother: Secondary | ICD-10-CM

## 2022-06-16 DIAGNOSIS — Z3689 Encounter for other specified antenatal screening: Secondary | ICD-10-CM

## 2022-06-16 DIAGNOSIS — O09292 Supervision of pregnancy with other poor reproductive or obstetric history, second trimester: Secondary | ICD-10-CM | POA: Diagnosis not present

## 2022-06-16 DIAGNOSIS — Z3A21 21 weeks gestation of pregnancy: Secondary | ICD-10-CM | POA: Diagnosis not present

## 2022-06-16 NOTE — Progress Notes (Signed)
Maternal-Fetal Medicine   Name: Kathleen Cain DOB: Jan 09, 1991 MRN: [id number] Referring Provider: [facility]  I had the pleasure of seeing Kathleen Cain [facility] at [facility]. She is G5 P2012 at 21w 3d gestation and is here for fetal anatomy scan.  On cell-free fetal DNA screening, the risks of fetal aneuploidies are not increased.  MSAFP screening showed low risk for open neural tube defects.  Early screening ruled out gestational diabetes.  Obstetric history significant for 2 term vaginal deliveries.  Her first child (2018) weighed 9 pounds and 13 ounces at birth and the second (2021) weighed 8 pounds and 14 ounces at birth.  Her first pregnancy was complicated by preeclampsia.  In the second pregnancy she took low-dose aspirin prophylaxis.  In this pregnancy, patient takes low-dose aspirin prophylaxis.  Patient reports no chronic medical conditions.  Ultrasound We performed a fetal anatomical survey.  Amniotic fluid is normal and good fetal activity seen.  Fetal biometry is 9 days ahead of gestational age.  An echogenic intracardiac focus is seen.  No other markers of aneuploidies are seen. 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  The left kidney appears normal.  Bladder appears normal.  No other obvious fetal structural defects are seen.  Renal Ectopia Pelvic kidney refers to an abnormally-located kidney. It is present in about 1 in 2,000 to 1 in 3,000 births with a slight female predominance.   It can be associated with other anomalies. I reassured the patient that rest of the fetal anatomy appears normal. 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 I reassured her that in the presence of normal-functioning contralateral kidney, no early delivery is indicated. I also informed her that vaginal delivery is not  contraindicated.  Although the ultrasound appearance is suggestive of ectopic renal kidney (pelvic), it is not confirmatory at this gestational age. Fetal kidneys are better seen in the third trimester (perinephric fat) and I have made an appointment for her to return in 4 weeks for reassessment.  In the absence of associated anomalies, the risk of chromosomal anomaly is very rare.  I recommended fetal echocardiography. She opted not to meet with our genetic counselor.   Echogenic intracardiac focus This is seen in about 3% to 4% of normal pregnancies.  It is a marker for Down syndrome.  Given that she had low risk for Down syndrome on cell-free fetal DNA screening, this is considered a normal variant.  I reassured the patient that the likelihood of Down syndrome is extremely low. I informed her that only amniocentesis will give a definitive result on the fetal karyotype.  Patient opted not to have amniocentesis.  History of preeclampsia Counseled the patient that recurrence of preeclampsia occurs in 25% to 50% of subsequent pregnancies.  I discussed the benefit of low-dose aspirin prophylaxis in delaying or preventing preeclampsia.  Her blood pressures were reportedly within normal range at prenatal visits.  Recommendations -An appointment was made for her to return in 4 weeks for fetal growth assessment and evaluation of kidneys. -We have requested an appointment for fetal echocardiography [facility] [place]). -Continue low-dose aspirin prophylaxis.   Thank you for consultation.  If you have any questions or concerns, please contact me the [facility].  Consultation including face-to-face (more than 50%) counseling 45 minutes.

## 2022-07-12 ENCOUNTER — Ambulatory Visit: Payer: Medicaid Other | Admitting: *Deleted

## 2022-07-12 ENCOUNTER — Ambulatory Visit: Payer: Medicaid Other | Attending: Obstetrics and Gynecology

## 2022-07-12 ENCOUNTER — Other Ambulatory Visit: Payer: Self-pay | Admitting: *Deleted

## 2022-07-12 ENCOUNTER — Encounter: Payer: Self-pay | Admitting: *Deleted

## 2022-07-12 DIAGNOSIS — O09292 Supervision of pregnancy with other poor reproductive or obstetric history, second trimester: Secondary | ICD-10-CM

## 2022-07-12 DIAGNOSIS — Z3A25 25 weeks gestation of pregnancy: Secondary | ICD-10-CM

## 2022-07-12 DIAGNOSIS — O283 Abnormal ultrasonic finding on antenatal screening of mother: Secondary | ICD-10-CM | POA: Insufficient documentation

## 2022-07-12 DIAGNOSIS — Z3689 Encounter for other specified antenatal screening: Secondary | ICD-10-CM | POA: Insufficient documentation

## 2022-07-12 DIAGNOSIS — O35EXX0 Maternal care for other (suspected) fetal abnormality and damage, fetal genitourinary anomalies, not applicable or unspecified: Secondary | ICD-10-CM | POA: Diagnosis present

## 2022-08-03 LAB — OB RESULTS CONSOLE RPR: RPR: NONREACTIVE

## 2022-08-03 LAB — OB RESULTS CONSOLE HIV ANTIBODY (ROUTINE TESTING): HIV: NONREACTIVE

## 2022-08-16 ENCOUNTER — Encounter: Payer: Self-pay | Admitting: *Deleted

## 2022-08-16 ENCOUNTER — Ambulatory Visit: Payer: Medicaid Other | Attending: Obstetrics

## 2022-08-16 ENCOUNTER — Other Ambulatory Visit: Payer: Self-pay | Admitting: *Deleted

## 2022-08-16 ENCOUNTER — Ambulatory Visit: Payer: Medicaid Other | Admitting: *Deleted

## 2022-08-16 VITALS — BP 114/60 | HR 82

## 2022-08-16 DIAGNOSIS — O3663X0 Maternal care for excessive fetal growth, third trimester, not applicable or unspecified: Secondary | ICD-10-CM | POA: Diagnosis not present

## 2022-08-16 DIAGNOSIS — O09299 Supervision of pregnancy with other poor reproductive or obstetric history, unspecified trimester: Secondary | ICD-10-CM | POA: Diagnosis present

## 2022-08-16 DIAGNOSIS — Z3A30 30 weeks gestation of pregnancy: Secondary | ICD-10-CM

## 2022-08-16 DIAGNOSIS — O283 Abnormal ultrasonic finding on antenatal screening of mother: Secondary | ICD-10-CM | POA: Insufficient documentation

## 2022-08-16 DIAGNOSIS — O35EXX0 Maternal care for other (suspected) fetal abnormality and damage, fetal genitourinary anomalies, not applicable or unspecified: Secondary | ICD-10-CM | POA: Diagnosis not present

## 2022-08-16 DIAGNOSIS — O09293 Supervision of pregnancy with other poor reproductive or obstetric history, third trimester: Secondary | ICD-10-CM | POA: Diagnosis not present

## 2022-09-14 ENCOUNTER — Ambulatory Visit: Payer: Medicaid Other | Attending: Maternal & Fetal Medicine

## 2022-09-14 ENCOUNTER — Other Ambulatory Visit: Payer: Self-pay | Admitting: *Deleted

## 2022-09-14 ENCOUNTER — Ambulatory Visit: Payer: Medicaid Other | Admitting: *Deleted

## 2022-09-14 VITALS — BP 121/65 | HR 92

## 2022-09-14 DIAGNOSIS — O3663X0 Maternal care for excessive fetal growth, third trimester, not applicable or unspecified: Secondary | ICD-10-CM

## 2022-09-14 DIAGNOSIS — O35EXX0 Maternal care for other (suspected) fetal abnormality and damage, fetal genitourinary anomalies, not applicable or unspecified: Secondary | ICD-10-CM | POA: Diagnosis not present

## 2022-09-14 DIAGNOSIS — O09299 Supervision of pregnancy with other poor reproductive or obstetric history, unspecified trimester: Secondary | ICD-10-CM

## 2022-09-14 DIAGNOSIS — O09293 Supervision of pregnancy with other poor reproductive or obstetric history, third trimester: Secondary | ICD-10-CM

## 2022-09-27 LAB — OB RESULTS CONSOLE GBS: GBS: NEGATIVE

## 2022-09-27 LAB — OB RESULTS CONSOLE GC/CHLAMYDIA
Chlamydia: NEGATIVE
Neisseria Gonorrhea: NEGATIVE

## 2022-10-11 ENCOUNTER — Ambulatory Visit: Payer: Medicaid Other | Attending: Maternal & Fetal Medicine

## 2022-10-11 ENCOUNTER — Ambulatory Visit: Payer: Medicaid Other | Admitting: *Deleted

## 2022-10-11 VITALS — BP 125/68 | HR 87

## 2022-10-11 DIAGNOSIS — O3663X0 Maternal care for excessive fetal growth, third trimester, not applicable or unspecified: Secondary | ICD-10-CM

## 2022-10-11 DIAGNOSIS — O09293 Supervision of pregnancy with other poor reproductive or obstetric history, third trimester: Secondary | ICD-10-CM | POA: Diagnosis present

## 2022-10-11 DIAGNOSIS — O35EXX0 Maternal care for other (suspected) fetal abnormality and damage, fetal genitourinary anomalies, not applicable or unspecified: Secondary | ICD-10-CM | POA: Diagnosis present

## 2022-10-13 ENCOUNTER — Other Ambulatory Visit: Payer: Self-pay | Admitting: Advanced Practice Midwife

## 2022-10-13 ENCOUNTER — Encounter (HOSPITAL_COMMUNITY): Payer: Self-pay | Admitting: *Deleted

## 2022-10-13 ENCOUNTER — Telehealth (HOSPITAL_COMMUNITY): Payer: Self-pay | Admitting: *Deleted

## 2022-10-13 DIAGNOSIS — O3663X0 Maternal care for excessive fetal growth, third trimester, not applicable or unspecified: Secondary | ICD-10-CM

## 2022-10-13 NOTE — Telephone Encounter (Signed)
Preadmission screen  

## 2022-10-17 ENCOUNTER — Inpatient Hospital Stay (HOSPITAL_COMMUNITY): Payer: Medicaid Other

## 2022-10-17 ENCOUNTER — Inpatient Hospital Stay (HOSPITAL_COMMUNITY)
Admission: RE | Admit: 2022-10-17 | Discharge: 2022-10-19 | DRG: 807 | Disposition: A | Discharge status: Home / Self Care | Payer: Medicaid Other | Attending: Obstetrics & Gynecology | Admitting: Obstetrics & Gynecology

## 2022-10-17 ENCOUNTER — Other Ambulatory Visit: Payer: Self-pay

## 2022-10-17 ENCOUNTER — Encounter (HOSPITAL_COMMUNITY): Payer: Self-pay | Admitting: Obstetrics and Gynecology

## 2022-10-17 DIAGNOSIS — Z7982 Long term (current) use of aspirin: Secondary | ICD-10-CM | POA: Diagnosis not present

## 2022-10-17 DIAGNOSIS — O3660X0 Maternal care for excessive fetal growth, unspecified trimester, not applicable or unspecified: Secondary | ICD-10-CM | POA: Diagnosis present

## 2022-10-17 DIAGNOSIS — O9902 Anemia complicating childbirth: Secondary | ICD-10-CM | POA: Diagnosis present

## 2022-10-17 DIAGNOSIS — O283 Abnormal ultrasonic finding on antenatal screening of mother: Secondary | ICD-10-CM | POA: Diagnosis present

## 2022-10-17 DIAGNOSIS — O358XX0 Maternal care for other (suspected) fetal abnormality and damage, not applicable or unspecified: Secondary | ICD-10-CM | POA: Diagnosis present

## 2022-10-17 DIAGNOSIS — O3663X0 Maternal care for excessive fetal growth, third trimester, not applicable or unspecified: Secondary | ICD-10-CM | POA: Diagnosis present

## 2022-10-17 DIAGNOSIS — Z3A39 39 weeks gestation of pregnancy: Secondary | ICD-10-CM

## 2022-10-17 LAB — CBC
HCT: 42.5 % (ref 36.0–46.0)
Hemoglobin: 13.7 g/dL (ref 12.0–15.0)
MCH: 32.1 pg (ref 26.0–34.0)
MCHC: 32.2 g/dL (ref 30.0–36.0)
MCV: 99.5 fL (ref 80.0–100.0)
Platelets: 139 10*3/uL — ABNORMAL LOW (ref 150–400)
RBC: 4.27 MIL/uL (ref 3.87–5.11)
RDW: 14.3 % (ref 11.5–15.5)
WBC: 9.1 10*3/uL (ref 4.0–10.5)
nRBC: 0 % (ref 0.0–0.2)

## 2022-10-17 LAB — TYPE AND SCREEN
ABO/RH(D): A POS
Antibody Screen: NEGATIVE

## 2022-10-17 MED ORDER — OXYCODONE-ACETAMINOPHEN 5-325 MG PO TABS: 1.0000 | ORAL_TABLET | ORAL | Status: DC | PRN

## 2022-10-17 MED ORDER — OXYCODONE-ACETAMINOPHEN 5-325 MG PO TABS: 2.0000 | ORAL_TABLET | ORAL | Status: DC | PRN

## 2022-10-17 MED ORDER — SOD CITRATE-CITRIC ACID 500-334 MG/5ML PO SOLN: 30.0000 mL | ORAL | Status: DC | PRN

## 2022-10-17 MED ORDER — FENTANYL CITRATE (PF) 100 MCG/2ML IJ SOLN: 100.0000 ug | INTRAMUSCULAR | Status: DC | PRN

## 2022-10-17 MED ORDER — ACETAMINOPHEN 325 MG PO TABS: 650.0000 mg | ORAL_TABLET | ORAL | Status: DC | PRN

## 2022-10-17 MED ORDER — ONDANSETRON HCL 4 MG/2ML IJ SOLN: 4.0000 mg | Freq: Four times a day (QID) | INTRAMUSCULAR | Status: DC | PRN

## 2022-10-17 MED ORDER — LACTATED RINGERS IV SOLN: INTRAVENOUS | Status: DC

## 2022-10-17 MED ORDER — MISOPROSTOL 25 MCG QUARTER TABLET
25.0000 ug | ORAL_TABLET | Freq: Once | ORAL | Status: AC
  Administered 2022-10-17: 25 ug via VAGINAL
  Filled 2022-10-17: qty 1

## 2022-10-17 MED ORDER — LIDOCAINE HCL (PF) 1 % IJ SOLN: 30.0000 mL | INTRAMUSCULAR | Status: DC | PRN

## 2022-10-17 MED ORDER — LACTATED RINGERS IV SOLN: 500.0000 mL | INTRAVENOUS | Status: DC | PRN

## 2022-10-17 MED ORDER — MISOPROSTOL 50MCG HALF TABLET
50.0000 ug | ORAL_TABLET | Freq: Once | ORAL | Status: AC
  Administered 2022-10-17: 50 ug via ORAL
  Filled 2022-10-17: qty 1

## 2022-10-17 MED ORDER — OXYTOCIN BOLUS FROM INFUSION
333.0000 mL | Freq: Once | INTRAVENOUS | Status: AC
  Administered 2022-10-18: 333 mL via INTRAVENOUS

## 2022-10-17 MED ORDER — OXYTOCIN-SODIUM CHLORIDE 30-0.9 UT/500ML-% IV SOLN
2.5000 [IU]/h | INTRAVENOUS | Status: DC
  Filled 2022-10-17: qty 500

## 2022-10-17 MED ORDER — TERBUTALINE SULFATE 1 MG/ML IJ SOLN: 0.2500 mg | Freq: Once | INTRAMUSCULAR | Status: DC | PRN

## 2022-10-17 NOTE — H&P (Signed)
OBSTETRIC ADMISSION HISTORY AND PHYSICAL  Kathleen Cain is a 31 y.o. female [id number] with IUP at [redacted]w[redacted]d by LMP presenting for IOL for LGA baby. She reports +FMs, No LOF, no VB, no blurry vision, headaches or peripheral edema, and RUQ pain.  She plans on breast feeding. She request OCPs for birth control. 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 LGA baby  Past Medical History: Past Medical History:  Diagnosis Date   History of pre-eclampsia in prior pregnancy, currently pregnant     Past Surgical History: Past Surgical History:  Procedure Laterality Date   NO PAST SURGERIES      Obstetrical History: OB History     Gravida  4   Para  2   Term  2   Preterm      AB  1   Living  2      SAB  1   IAB      Ectopic      Multiple  0   Live Births  2           Social History Social History   Socioeconomic History   Marital status: Married    Spouse name: Not on file   Number of children: Not on file   Years of education: Not on file   Highest education level: Not on file  Occupational History   Not on file  Tobacco Use   Smoking status: Never   Smokeless tobacco: Never  Vaping Use   Vaping Use: Never used  Substance and Sexual Activity   Alcohol use: No   Drug use: No   Sexual activity: Yes    Birth control/protection: None  Other Topics Concern   Not on file  Social History Narrative   Not on file   Social Determinants of Health   Financial Resource Strain: Not on file  Food Insecurity: No Food Insecurity (10/17/2022)   Hunger Vital Sign    Worried About Running Out of Food in the Last Year: Never true    Ran Out of Food in the Last Year: Never true  Transportation Needs: No Transportation Needs  (10/17/2022)   PRAPARE - Hydrologist (Medical): No    Lack of Transportation (Non-Medical): No  Physical Activity: Not on file  Stress: Not on file  Social Connections: Not on file    Family History: Family History  Problem Relation Age of Onset   Cervical cancer Mother    Hypertension Maternal Grandmother    Asthma Neg Hx    Diabetes Neg Hx    Heart disease Neg Hx     Allergies: No Known Allergies  Medications Prior to Admission  Medication Sig Dispense Refill Last Dose   aspirin EC 81 MG tablet Take 81 mg by mouth daily. Swallow whole.      Prenatal Vit-Fe Fumarate-FA (PRENATAL MULTIVITAMIN) TABS tablet Take 1 tablet by mouth daily at 12 noon.      triamcinolone (NASACORT) 55 MCG/ACT AERO nasal inhaler Place 2 sprays into the nose daily. (Patient not taking: Reported on 08/16/2022) 1 each 1      Review of Systems   All systems reviewed and negative except as stated in HPI  Blood pressure 116/72, pulse  84, temperature 98.7 F (37.1 C), temperature source Oral, resp. rate 18, height 5\' 5"  (1.651 m), weight 88.5 kg, last menstrual period 01/17/2022. General appearance: alert, cooperative, and appears stated age Lungs: clear to auscultation bilaterally Heart: regular rate and rhythm Abdomen: soft, non-tender; bowel sounds normal Pelvic: see below Extremities: Homans sign is negative, no sign of DVT  Presentation: cephalic  Fetal monitoring: 135bpm, moderate variability, +accels, no decels  Uterine activity: painless contractions, about every 2-4 mins.  Dilation: Closed Effacement (%): Thick Station: Ballotable Exam by:: Dr. 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  craniosynostosis  Prenatal Transfer Tool  Maternal Diabetes: No Genetic Screening: low risk Maternal Ultrasounds/Referrals: Fetal Kidney Anomalies, Fetal Heart Anomalies, and Other: concern about possible craniosynostosis Fetal Ultrasounds or other Referrals:  Fetal echo - showed echogenic focus on mitral valve papillary apparatus Maternal Substance Abuse:  No Significant Maternal Medications:  None Significant Maternal Lab Results:  Group B Strep negative Number of Prenatal Visits:greater than 3 verified prenatal visits Other Comments:  None  No results found for this or any previous visit (from the past 24 hour(s)).  Patient Active Problem List   Diagnosis Date Noted   Large for gestational age fetus affecting management of mother 10/17/2022   Premature rupture of membranes 07/28/2020   Single liveborn, born in hospital, delivered by vaginal delivery 07/28/2020   Status post vacuum-assisted vaginal delivery 12/06/2017   Post-dates pregnancy 12/02/2017   SAB (spontaneous abortion) 01/18/2017    Assessment/Plan:  Kathleen Cain is a 31 y.o. 26 at [redacted]w[redacted]d here for IOL due to LGA baby. She has had a proven pelvis to 4455g. Other concerns noted in fetus include R renal pelvis, concern for possible craniosynostosis of coronal suture  #Labor:admit for IOL #Pain: Desires epidural in active labor #FWB: Cat 1 #ID:  GBS neg #MOF: breast #MOC:OCPs #Circ:  N/a  [redacted]w[redacted]d MD MPH OB Fellow, [facility], [facility] for [facility] 10/17/2022

## 2022-10-18 ENCOUNTER — Encounter (HOSPITAL_COMMUNITY): Payer: Self-pay | Admitting: Obstetrics & Gynecology

## 2022-10-18 ENCOUNTER — Inpatient Hospital Stay (HOSPITAL_COMMUNITY): Payer: Medicaid Other | Admitting: Anesthesiology

## 2022-10-18 DIAGNOSIS — Z3A39 39 weeks gestation of pregnancy: Secondary | ICD-10-CM

## 2022-10-18 DIAGNOSIS — O283 Abnormal ultrasonic finding on antenatal screening of mother: Secondary | ICD-10-CM | POA: Diagnosis present

## 2022-10-18 DIAGNOSIS — O3663X0 Maternal care for excessive fetal growth, third trimester, not applicable or unspecified: Secondary | ICD-10-CM

## 2022-10-18 LAB — RPR: RPR Ser Ql: NONREACTIVE

## 2022-10-18 MED ORDER — COCONUT OIL OIL: 1.0000 | TOPICAL_OIL | Status: DC | PRN

## 2022-10-18 MED ORDER — PHENYLEPHRINE 80 MCG/ML (10ML) SYRINGE FOR IV PUSH (FOR BLOOD PRESSURE SUPPORT): 80.0000 ug | PREFILLED_SYRINGE | INTRAVENOUS | Status: DC | PRN

## 2022-10-18 MED ORDER — DIPHENHYDRAMINE HCL 50 MG/ML IJ SOLN: 12.5000 mg | INTRAMUSCULAR | Status: DC | PRN

## 2022-10-18 MED ORDER — OXYCODONE HCL 5 MG PO TABS: 5.0000 mg | ORAL_TABLET | ORAL | Status: DC | PRN

## 2022-10-18 MED ORDER — EPHEDRINE 5 MG/ML INJ: 10.0000 mg | INTRAVENOUS | Status: DC | PRN

## 2022-10-18 MED ORDER — LACTATED RINGERS IV SOLN: 500.0000 mL | Freq: Once | INTRAVENOUS | Status: DC

## 2022-10-18 MED ORDER — DIBUCAINE (PERIANAL) 1 % EX OINT: 1.0000 | TOPICAL_OINTMENT | CUTANEOUS | Status: DC | PRN

## 2022-10-18 MED ORDER — IBUPROFEN 600 MG PO TABS
600.0000 mg | ORAL_TABLET | Freq: Four times a day (QID) | ORAL | Status: DC
  Administered 2022-10-18 – 2022-10-19 (×5): 600 mg via ORAL
  Filled 2022-10-18 (×5): qty 1

## 2022-10-18 MED ORDER — SIMETHICONE 80 MG PO CHEW: 80.0000 mg | CHEWABLE_TABLET | ORAL | Status: DC | PRN

## 2022-10-18 MED ORDER — DIPHENHYDRAMINE HCL 25 MG PO CAPS: 25.0000 mg | ORAL_CAPSULE | Freq: Four times a day (QID) | ORAL | Status: DC | PRN

## 2022-10-18 MED ORDER — ACETAMINOPHEN 325 MG PO TABS: 650.0000 mg | ORAL_TABLET | ORAL | Status: DC | PRN

## 2022-10-18 MED ORDER — PHENYLEPHRINE 80 MCG/ML (10ML) SYRINGE FOR IV PUSH (FOR BLOOD PRESSURE SUPPORT)
80.0000 ug | PREFILLED_SYRINGE | INTRAVENOUS | Status: DC | PRN
  Filled 2022-10-18: qty 10

## 2022-10-18 MED ORDER — TETANUS-DIPHTH-ACELL PERTUSSIS 5-2.5-18.5 LF-MCG/0.5 IM SUSY: 0.5000 mL | PREFILLED_SYRINGE | Freq: Once | INTRAMUSCULAR | Status: DC

## 2022-10-18 MED ORDER — WITCH HAZEL-GLYCERIN EX PADS: 1.0000 | MEDICATED_PAD | CUTANEOUS | Status: DC | PRN

## 2022-10-18 MED ORDER — SENNOSIDES-DOCUSATE SODIUM 8.6-50 MG PO TABS
2.0000 | ORAL_TABLET | Freq: Every day | ORAL | Status: DC
  Administered 2022-10-19: 2 via ORAL
  Filled 2022-10-18: qty 2

## 2022-10-18 MED ORDER — ONDANSETRON HCL 4 MG PO TABS: 4.0000 mg | ORAL_TABLET | ORAL | Status: DC | PRN

## 2022-10-18 MED ORDER — LIDOCAINE HCL (PF) 1 % IJ SOLN
INTRAMUSCULAR | Status: DC | PRN
  Administered 2022-10-18: 2 mL via EPIDURAL
  Administered 2022-10-18: 10 mL via EPIDURAL

## 2022-10-18 MED ORDER — BENZOCAINE-MENTHOL 20-0.5 % EX AERO
1.0000 | INHALATION_SPRAY | CUTANEOUS | Status: DC | PRN
  Filled 2022-10-18: qty 56

## 2022-10-18 MED ORDER — ZOLPIDEM TARTRATE 5 MG PO TABS: 5.0000 mg | ORAL_TABLET | Freq: Every evening | ORAL | Status: DC | PRN

## 2022-10-18 MED ORDER — OXYCODONE HCL 5 MG PO TABS: 10.0000 mg | ORAL_TABLET | ORAL | Status: DC | PRN

## 2022-10-18 MED ORDER — FENTANYL-BUPIVACAINE-NACL 0.5-0.125-0.9 MG/250ML-% EP SOLN
EPIDURAL | Status: DC | PRN
  Administered 2022-10-18: 12 mL/h via EPIDURAL

## 2022-10-18 MED ORDER — PRENATAL MULTIVITAMIN CH
1.0000 | ORAL_TABLET | Freq: Every day | ORAL | Status: DC
  Administered 2022-10-18 – 2022-10-19 (×2): 1 via ORAL
  Filled 2022-10-18 (×2): qty 1

## 2022-10-18 MED ORDER — ONDANSETRON HCL 4 MG/2ML IJ SOLN: 4.0000 mg | INTRAMUSCULAR | Status: DC | PRN

## 2022-10-18 MED ORDER — FENTANYL-BUPIVACAINE-NACL 0.5-0.125-0.9 MG/250ML-% EP SOLN
12.0000 mL/h | EPIDURAL | Status: DC | PRN
  Filled 2022-10-18: qty 250

## 2022-10-18 NOTE — Discharge Summary (Signed)
Postpartum Discharge Summary  Date of Service updated***     Patient Name: Kathleen Cain DOB: 05-Sep-1991 MRN: [id number]  Date of admission: 10/17/2022 Delivery date:10/18/2022  Delivering provider: Cecilio Asper  Date of discharge: 10/18/2022  Admitting diagnosis: Large for gestational age fetus affecting management of mother [O36.60X0] Intrauterine pregnancy: [redacted]w[redacted]d    Secondary diagnosis:  Principal Problem:   Large for gestational age fetus affecting management of mother Active Problems:   Abnormal fetal ultrasound  Additional problems: none    Discharge diagnosis: Term Pregnancy Delivered                                              Post partum procedures: none Augmentation: Cytotec Complications: None  Hospital course: Induction of Labor With Vaginal Delivery   31y.o. yo [id number] 326w1das admitted to the hospital 10/17/2022 for induction of labor.  Indication for induction:  potential LGA .  Patient had an unremarkable labor course, progressing to complete dilation after a 50/25 dose of cytotec followed by SROM once active.   Membrane Rupture Time/Date: 3:08 AM ,10/18/2022   Delivery Method:Vaginal, Spontaneous  Episiotomy: None  Lacerations:  1st degree  Details of delivery can be found in separate delivery note.  Patient had a postpartum course complicated by***. Patient is discharged home 10/18/22.  Newborn Data: Birth date:10/18/2022  Birth time:5:40 AM  Gender:Female  Living status:Living  Apgars:8 ,9  Weight:4380 g (9lb 10.5oz)  Magnesium Sulfate received: No BMZ received: No Rhophylac:N/A MMR:N/A T-DaP:Given prenatally Flu: No Transfusion:No  Physical exam  Vitals:   10/18/22 0615 10/18/22 0630 10/18/22 0645 10/18/22 0700  BP: (!) 119/93 119/78 (!) 111/98 121/74  Pulse: 91 91 96 88  Resp:      Temp:      TempSrc:      SpO2:      Weight:      Height:       General: {Exam; general:21111117} Lochia: {Desc;  appropriate/inappropriate:30686::"appropriate"} Uterine Fundus: {Desc; firm/soft:30687} Incision: {Exam; incision:21111123} DVT Evaluation: {Exam; dvt:2111122} Labs: Lab Results  Component Value Date   WBC 9.1 10/17/2022   HGB 13.7 10/17/2022   HCT 42.5 10/17/2022   MCV 99.5 10/17/2022   PLT 139 (L) 10/17/2022      Latest Ref Rng & Units 12/02/2017    4:33 PM  CMP  Glucose 65 - 99 mg/dL 104   BUN 6 - 20 mg/dL 7   Creatinine 0.44 - 1.00 mg/dL 0.41   Sodium 135 - 145 mmol/L 133   Potassium 3.5 - 5.1 mmol/L 4.1   Chloride 101 - 111 mmol/L 102   CO2 22 - 32 mmol/L 19   Calcium 8.9 - 10.3 mg/dL 9.2   Total Protein 6.5 - 8.1 g/dL 7.2   Total Bilirubin 0.3 - 1.2 mg/dL 0.5   Alkaline Phos 38 - 126 U/L 324   AST 15 - 41 U/L 29   ALT 14 - 54 U/L 13    Edinburgh Score:    07/29/2020    7:19 AM  Edinburgh Postnatal Depression Scale Screening Tool  I have been able to laugh and see the funny side of things. 0  I have looked forward with enjoyment to things. 0  I have blamed myself unnecessarily when things went wrong. 0  I have been anxious or worried for no good reason. 0  I  have felt scared or panicky for no good reason. 0  Things have been getting on top of me. 0  I have been so unhappy that I have had difficulty sleeping. 0  I have felt sad or miserable. 0  I have been so unhappy that I have been crying. 0  The thought of harming myself has occurred to me. 0  Edinburgh Postnatal Depression Scale Total 0     After visit meds:  Allergies as of 10/18/2022   No Known Allergies   Med Rec must be completed prior to using this [facility]***        Discharge home in stable condition Infant Feeding: {Baby feeding:23562} Infant Disposition:{CHL IP OB HOME WITH WSBBJX:53692} Discharge instruction: per After Visit Summary and Postpartum booklet. Activity: Advance as tolerated. Pelvic rest for 6 weeks.  Diet: routine diet Future Appointments:No future appointments. Follow  up Visit:  Follow-up Information     Department, [facility]. Schedule an appointment as soon as possible for a visit in 4 week(s).   Why: For your postpartum appointment Contact information: [place] [address] [phone]                  10/18/2022 Myrtis Ser, CNM

## 2022-10-18 NOTE — Anesthesia Procedure Notes (Signed)
Epidural Patient location during procedure: OB Start time: 10/18/2022 5:08 AM End time: 10/18/2022 5:17 AM  Staffing Anesthesiologist: Pervis Hocking, DO Performed: anesthesiologist   Preanesthetic Checklist Completed: patient identified, IV checked, risks and benefits discussed, monitors and equipment checked, pre-op evaluation and timeout performed  Epidural Patient position: sitting Prep: DuraPrep and site prepped and draped Patient monitoring: continuous pulse ox, blood pressure, heart rate and cardiac monitor Approach: midline Location: L3-L4 Injection technique: LOR air  Needle:  Needle type: Tuohy  Needle gauge: 17 G Needle length: 9 cm Needle insertion depth: 5.5 cm Catheter type: closed end flexible Catheter size: 19 Gauge Catheter at skin depth: 11 cm Test dose: negative  Assessment Sensory level: T8 Events: blood not aspirated, injection not painful, no injection resistance, no paresthesia and negative IV test  Additional Notes Patient identified. Risks/Benefits/Options discussed with patient including but not limited to bleeding, infection, nerve damage, paralysis, failed block, incomplete pain control, headache, blood pressure changes, nausea, vomiting, reactions to medication both or allergic, itching and postpartum back pain. Confirmed with bedside nurse the patient's most recent platelet count. Confirmed with patient that they are not currently taking any anticoagulation, have any bleeding history or any family history of bleeding disorders. Patient expressed understanding and wished to proceed. All questions were answered. Sterile technique was used throughout the entire procedure. Please see nursing notes for vital signs. Test dose was given through epidural catheter and negative prior to continuing to dose epidural or start infusion. Warning signs of high block given to the patient including shortness of breath, tingling/numbness in hands, complete motor  block, or any concerning symptoms with instructions to call for help. Patient was given instructions on fall risk and not to get out of bed. All questions and concerns addressed with instructions to call with any issues or inadequate analgesia.  Reason for block:procedure for pain

## 2022-10-18 NOTE — Anesthesia Preprocedure Evaluation (Signed)
Anesthesia Evaluation  Patient identified by MRN, date of birth, ID band Patient awake    Reviewed: Allergy & Precautions, Patient's Chart, lab work & pertinent test results  Airway Mallampati: II  TM Distance: >3 FB Neck ROM: Full    Dental no notable dental hx.    Pulmonary neg pulmonary ROS,    Pulmonary exam normal breath sounds clear to auscultation       Cardiovascular negative cardio ROS Normal cardiovascular exam Rhythm:Regular Rate:Normal     Neuro/Psych negative neurological ROS  negative psych ROS   GI/Hepatic negative GI ROS, Neg liver ROS,   Endo/Other  negative endocrine ROS  Renal/GU negative Renal ROS  negative genitourinary   Musculoskeletal negative musculoskeletal ROS (+)   Abdominal   Peds negative pediatric ROS (+)  Hematology Hb 13.7, plt 139 Gestational thromobcytopenia    Anesthesia Other Findings   Reproductive/Obstetrics (+) Pregnancy                             Anesthesia Physical Anesthesia Plan  ASA: 2  Anesthesia Plan: Epidural   Post-op Pain Management:    Induction:   PONV Risk Score and Plan: 2  Airway Management Planned: Natural Airway  Additional Equipment: None  Intra-op Plan:   Post-operative Plan:   Informed Consent: I have reviewed the patients History and Physical, chart, labs and discussed the procedure including the risks, benefits and alternatives for the proposed anesthesia with the patient or authorized representative who has indicated his/her understanding and acceptance.       Plan Discussed with:   Anesthesia Plan Comments:         Anesthesia Quick Evaluation

## 2022-10-18 NOTE — Anesthesia Postprocedure Evaluation (Signed)
Anesthesia Post Note  Patient: Kathleen Cain  Procedure(s) Performed: AN AD [place]     Patient location during evaluation: Mother Baby Anesthesia Type: Epidural Level of consciousness: awake and alert Pain management: pain level controlled Vital Signs Assessment: post-procedure vital signs reviewed and stable Respiratory status: spontaneous breathing, nonlabored ventilation and respiratory function stable Cardiovascular status: stable Postop Assessment: no headache, no backache and epidural receding Anesthetic complications: no   No notable events documented.  Last Vitals:  Vitals:   10/18/22 0645 10/18/22 0700  BP: (!) 111/98 121/74  Pulse: 96 88  Resp:    Temp:    SpO2:      Last Pain:  Vitals:   10/18/22 1105  TempSrc:   PainSc: 0-No pain   Pain Goal:                   Apurva Reily

## 2022-10-18 NOTE — Progress Notes (Signed)
Labor Progress Note Kathleen Cain is a 31 y.o. [id number] at [redacted]w[redacted]d presented for IOL for LGA fetus.  S: Patient is resting comfortably, feeling contractions but pain is bearable. No LOF. Up and moving around well.   O:  BP 108/62   Pulse 81   Temp 98.7 F (37.1 C) (Oral)   Resp 18   Ht 5\' 5"  (1.651 m)   Wt 88.5 kg   LMP 01/17/2022   BMI 32.45 kg/m  EFM: 145 bpm/good variability/accel present, no decel  CVE: Dilation: 2 Effacement (%): Thick Station: -2 Presentation: Vertex Exam by:: Dr. Elouise Munroe   A&P: 31 y.o. [id number] [redacted]w[redacted]d here for IOL for LGA infant.  #Labor: Progressing well. Making small cervical change after first round of cytotec, contracting frequently. Will hold off on additional doses for now. Patient amenable to FB if appropriate at next check.  #Pain: desires epidural when pain worse/active labor #FWB: category 1 #GBS negative  Cecilio Asper, [place] for [place] 12:48 AM

## 2022-10-19 ENCOUNTER — Other Ambulatory Visit (HOSPITAL_COMMUNITY): Payer: Self-pay

## 2022-10-19 MED ORDER — DROSPIRENONE 4 MG PO TABS
4.0000 mg | ORAL_TABLET | Freq: Every day | ORAL | 2 refills | Status: AC
  Filled 2022-10-19: qty 28, 28d supply, fill #0

## 2022-10-19 MED ORDER — ACETAMINOPHEN 325 MG PO TABS
650.0000 mg | ORAL_TABLET | ORAL | 0 refills | Status: AC | PRN
  Filled 2022-10-19: qty 30, 3d supply, fill #0

## 2022-10-19 MED ORDER — IBUPROFEN 600 MG PO TABS
600.0000 mg | ORAL_TABLET | Freq: Four times a day (QID) | ORAL | 0 refills | Status: AC
  Filled 2022-10-19: qty 30, 8d supply, fill #0

## 2022-10-19 MED ORDER — SENNOSIDES-DOCUSATE SODIUM 8.6-50 MG PO TABS
2.0000 | ORAL_TABLET | Freq: Every day | ORAL | 0 refills | Status: AC
  Filled 2022-10-19: qty 30, 15d supply, fill #0

## 2022-10-19 MED ORDER — BENZOCAINE-MENTHOL 20-0.5 % EX AERO
1.0000 | INHALATION_SPRAY | CUTANEOUS | 0 refills | Status: AC | PRN
  Filled 2022-10-19: qty 78, fill #0

## 2022-10-28 ENCOUNTER — Telehealth (HOSPITAL_COMMUNITY): Payer: Self-pay | Admitting: *Deleted

## 2022-10-28 NOTE — Telephone Encounter (Signed)
Patient voiced no questions or concerns regarding her health at this time. EPDS=0. Patient voiced no questions or concerns regarding infant at this time. Patient reports infant sleeps in a crib on her back. RN reviewed ABCs of safe sleep. Patient verbalized understanding. Patient informed about hospital's virtual postpartum classes and support groups. Declined email information at this time. Deforest Hoyles, RN, 10/28/22, [phone]

## 2022-12-06 IMAGING — US US OB < 14 WEEKS - US OB TV
1 series · 15 of 28 positions shown · non-contrast
Comparison: None

CLINICAL DATA: Spotting for 1 week in a 29-year-old female with
last menstrual period of 05/15/2021 found to be pregnant,
quantitative beta HCG currently pending. Gestational age by last
menstrual period 8 weeks 6 days.

EXAM:
OBSTETRIC <14 WK US AND TRANSVAGINAL OB US
TECHNIQUE: Both transabdominal and transvaginal ultrasound examinations were
performed for complete evaluation of the gestation as well as the
maternal uterus, adnexal regions, and pelvic cul-de-sac.
Transvaginal technique was performed to assess early pregnancy.

[Series 1: us ob < 14 weeks - us ob tv · 51 acquisitions, 15 frames shown]
[im 1/51]
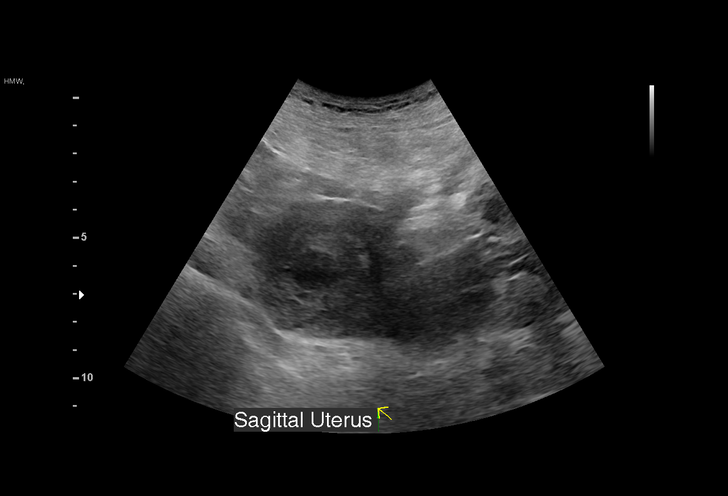
[im 4/51]
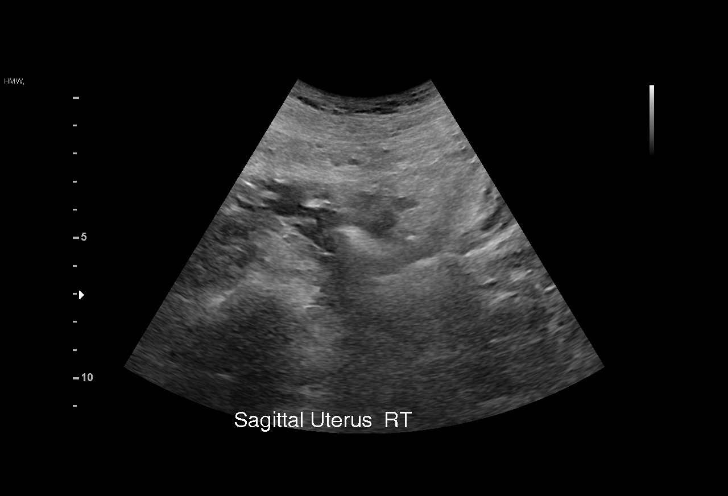
[im 8/51]
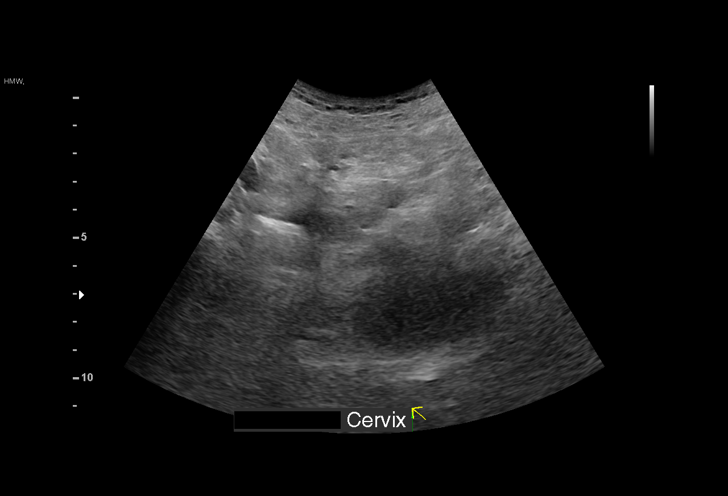
[im 12/51]
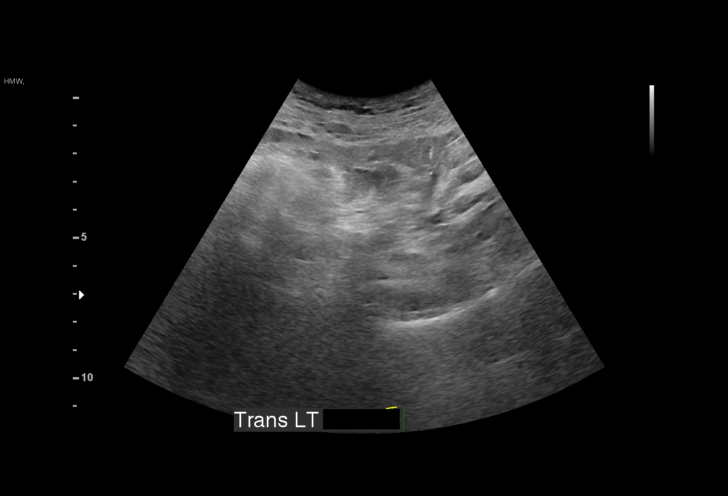
[im 15/51]
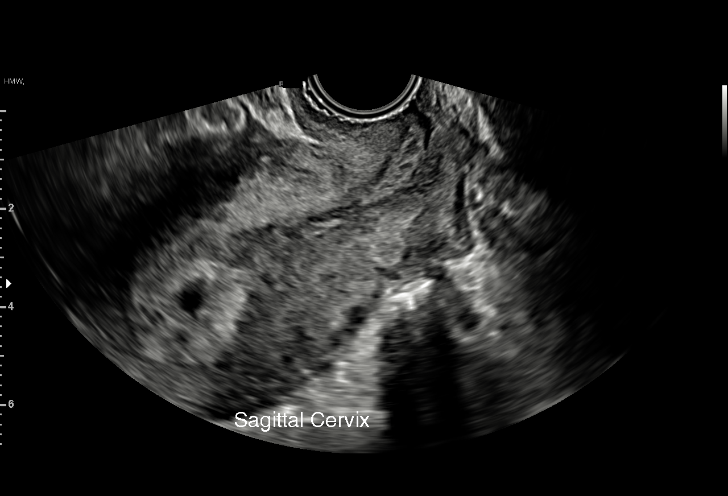
[im 19/51]
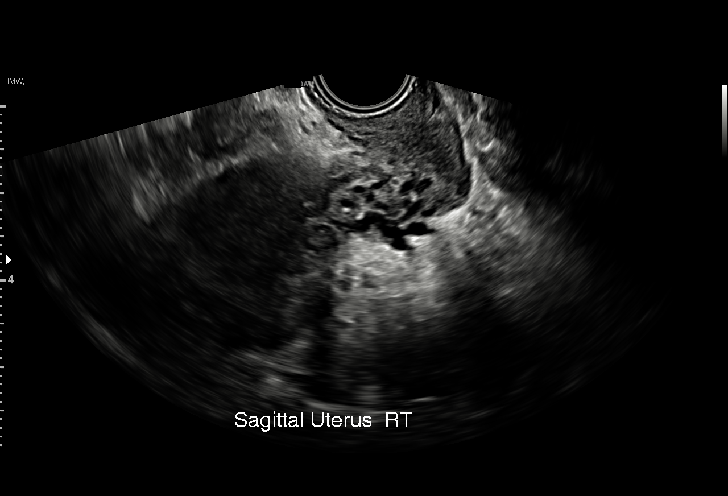
[im 23/51]
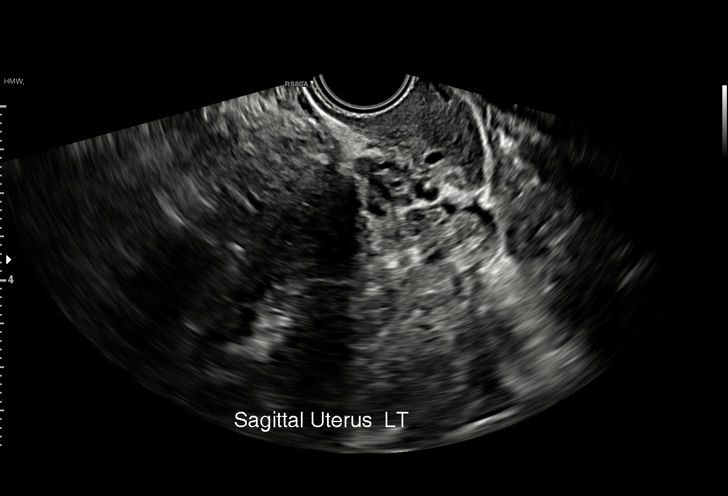
[im 26/51]
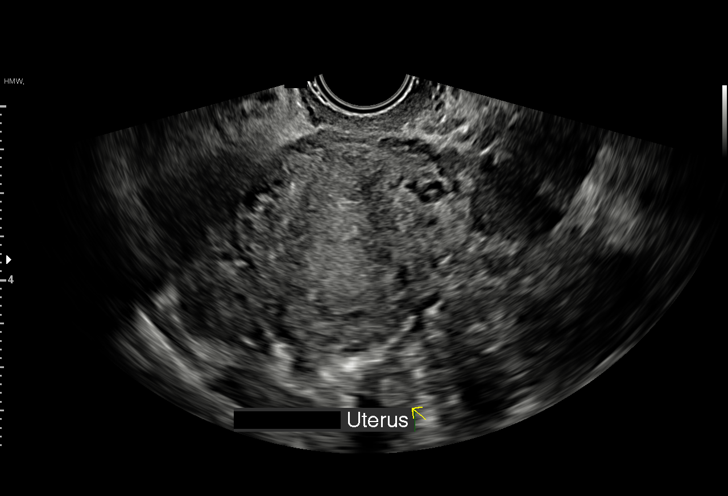
[im 28/51]
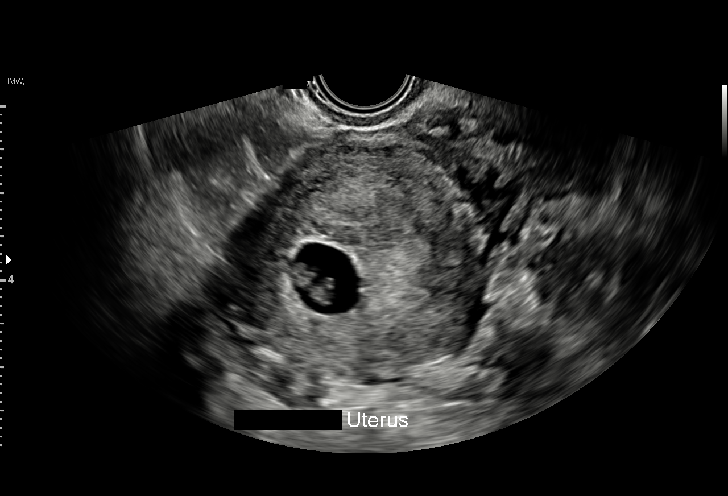
[im 32/51]
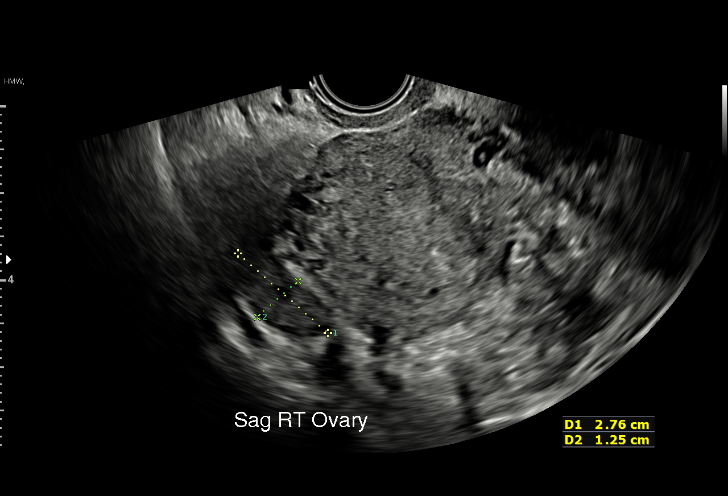
[im 36/51]
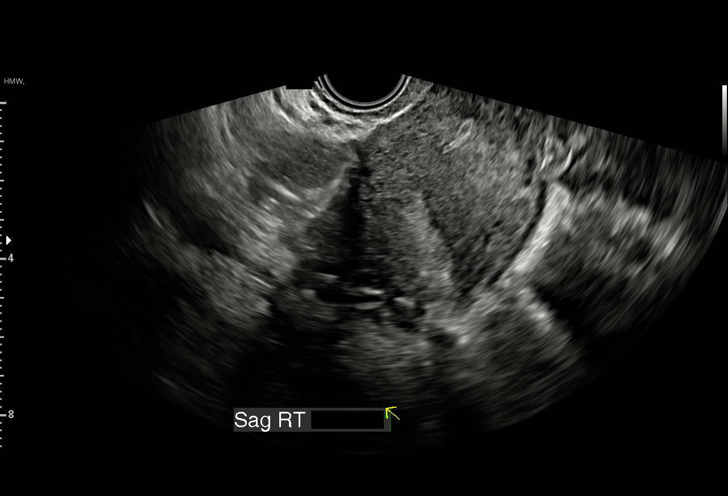
[im 39/51]
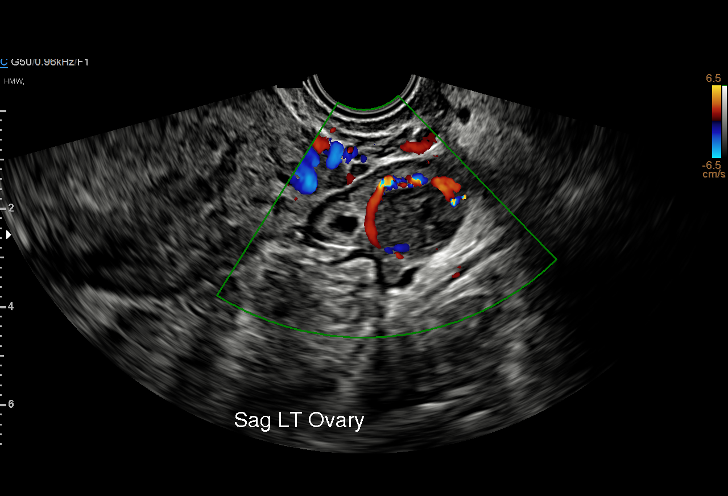
[im 43/51]
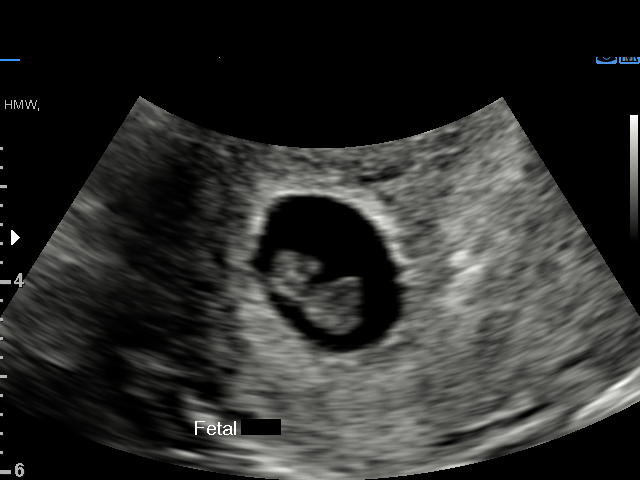
[im 47/51]
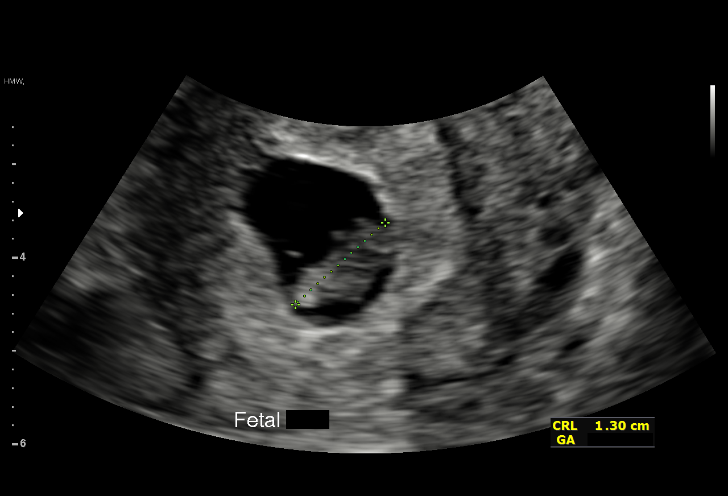
[im 51/51]
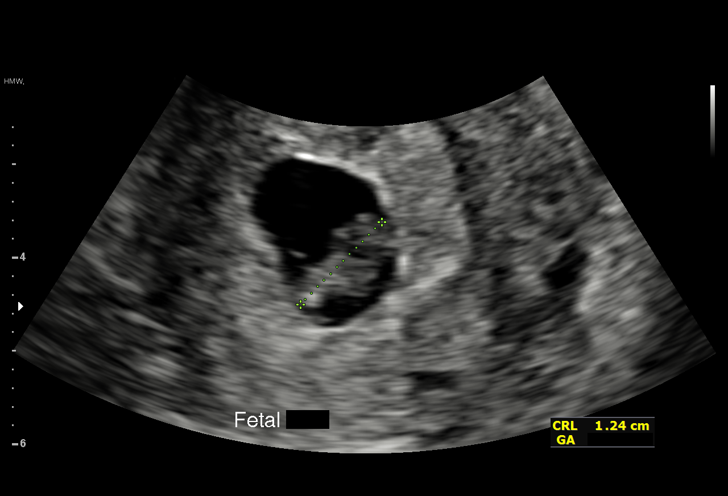

[15 of 28 positions shown; findings below may reference images not displayed]

FINDINGS: Intrauterine gestational sac: Single

Yolk sac:  Not visualized

Embryo:  Visualized

Cardiac Activity: Not visualized

CRL: 12.8 mm   7 w   3 d                  US EDC: 03/01/2022

Subchorionic hemorrhage:  None visualized.

Maternal uterus/adnexae: Trace free fluid in the pelvis. Involuting
corpus luteum of the LEFT ovary.
IMPRESSION: Findings meet definitive criteria for failed pregnancy. Gestational
sac in the uterine fundus with crown-rump length of greater than 7
mm at 12.8 mm with no cardiac activity. This follows SRU consensus
guidelines: Diagnostic Criteria for Nonviable Pregnancy Early in the
First Trimester. N Engl J Med 5041;[DATE].

## 2024-02-04 ENCOUNTER — Other Ambulatory Visit (HOSPITAL_COMMUNITY): Payer: Self-pay

## 2024-07-31 ENCOUNTER — Emergency Department (HOSPITAL_COMMUNITY)
Admission: EM | Admit: 2024-07-31 | Discharge: 2024-07-31 | Disposition: A | Discharge status: Home / Self Care | Attending: Emergency Medicine | Admitting: Emergency Medicine

## 2024-07-31 ENCOUNTER — Other Ambulatory Visit: Payer: Self-pay

## 2024-07-31 ENCOUNTER — Emergency Department (HOSPITAL_COMMUNITY)

## 2024-07-31 ENCOUNTER — Encounter (HOSPITAL_COMMUNITY): Payer: Self-pay

## 2024-07-31 DIAGNOSIS — M25511 Pain in right shoulder: Secondary | ICD-10-CM | POA: Diagnosis present

## 2024-07-31 DIAGNOSIS — M5410 Radiculopathy, site unspecified: Secondary | ICD-10-CM | POA: Diagnosis not present

## 2024-07-31 MED ORDER — METHYLPREDNISOLONE 4 MG PO TBPK: ORAL_TABLET | ORAL | 0 refills | Status: AC

## 2024-07-31 MED ORDER — DEXAMETHASONE 4 MG PO TABS
10.0000 mg | ORAL_TABLET | Freq: Once | ORAL | Status: AC
  Administered 2024-07-31 (×2): 10 mg via ORAL
  Filled 2024-07-31: qty 3

## 2024-07-31 MED ORDER — KETOROLAC TROMETHAMINE 60 MG/2ML IM SOLN
60.0000 mg | Freq: Once | INTRAMUSCULAR | Status: AC
  Administered 2024-07-31 (×2): 60 mg via INTRAMUSCULAR
  Filled 2024-07-31: qty 2

## 2024-07-31 NOTE — ED Notes (Signed)
 Pt gives verbal consent for mse

## 2024-07-31 NOTE — ED Provider Triage Note (Signed)
 Emergency Medicine Provider Triage Evaluation Note  Kathleen Cain , a 33 y.o. female  was evaluated in triage.  Pt complains of right-sided shoulder pain that started around a week ago.  Denies any known injury.  Reports some sharp sensation, occasional tingling travels from right fingertips to right scapula.  Review of Systems  Positive: Shoulder pain Negative:   Physical Exam  BP 122/73 (BP Location: Right Arm)   Pulse 68   Temp 98.3 F (36.8 C)   Resp 17   SpO2 97%  Gen:   Awake, no distress   Resp:  Normal effort  MSK:   Moves extremities without difficulty  Other:  Normal range of motion of right arm throughout, radial, ulnar pulses 2+ in affected extremity. TTP over right scapula  Medical Decision Making  Medically screening exam initiated at 2:18 PM.  Appropriate orders placed.  Kathleen Cain was informed that the remainder of the evaluation will be completed by another provider, this initial triage assessment does not replace that evaluation, and the importance of remaining in the ED until their evaluation is complete.  Workup initiated in triage    Rosan Sherlean DEL, PA-C 07/31/24 1420

## 2024-07-31 NOTE — Discharge Instructions (Addendum)
 Take next dose of steroid tomorrow.  Recommend 1000 mg of Tylenol  every 6 hours as needed for pain.  Follow-up with orthopedics.

## 2024-07-31 NOTE — ED Provider Notes (Signed)
 [place] EMERGENCY DEPARTMENT AT [facility] Provider Note   CSN: [id number] Arrival date & time: 07/31/24  1358     Patient presents with: Arm Pain   Kathleen Cain is a 33 y.o. female.   Patient here with pain in the right shoulder that shoots down her right arm and into her 1st and 2nd fingertips.  She denies any neck pain trauma.  Denies any weakness.  She denies any fevers or chills.  No chest pain or shortness of breath.  She has no concern for pregnancy.  She denies any specific trauma.    The history is provided by the patient.       Prior to Admission medications   Medication Sig Start Date End Date Taking? Authorizing Provider  methylPREDNISolone  (MEDROL  DOSEPAK) 4 MG TBPK tablet Follow package insert 07/31/24  Yes Trace Cederberg, DO  acetaminophen  (TYLENOL ) 325 MG tablet Take 2 tablets (650 mg total) by mouth every 4 (four) hours as needed (for pain scale < 4). 10/19/22   Mercado-Ortiz, Harlene RODES, DO  benzocaine -Menthol  (DERMOPLAST) 20-0.5 % AERO Apply 1 Application topically as needed for irritation (perineal discomfort). 10/19/22   Mercado-Ortiz, Harlene RODES, DO  Drospirenone  4 MG TABS Take 1 tablet (4 mg) by mouth daily. 10/19/22   Doyce Sharper, MD  ibuprofen  (ADVIL ) 600 MG tablet Take 1 tablet (600 mg total) by mouth every 6 (six) hours. 10/19/22   Mercado-Ortiz, Harlene RODES, DO  senna-docusate (SENOKOT-S) 8.6-50 MG tablet Take 2 tablets by mouth daily. 10/19/22   Mercado-Ortiz, Harlene RODES, DO    Allergies: Patient has no known allergies.    Review of Systems  Updated Vital Signs BP (!) 133/103   Pulse 68   Temp 98.3 F (36.8 C)   Resp 15   SpO2 100%   Physical Exam Vitals and nursing note reviewed.  Constitutional:      General: She is not in acute distress.    Appearance: She is well-developed.  HENT:     Head: Normocephalic and atraumatic.  Eyes:     Extraocular Movements: Extraocular movements intact.      Conjunctiva/sclera: Conjunctivae normal.     Pupils: Pupils are equal, round, and reactive to light.  Cardiovascular:     Rate and Rhythm: Normal rate and regular rhythm.     Heart sounds: No murmur heard. Pulmonary:     Effort: Pulmonary effort is normal. No respiratory distress.     Breath sounds: Normal breath sounds.  Abdominal:     Palpations: Abdomen is soft.     Tenderness: There is no abdominal tenderness.  Musculoskeletal:        General: Tenderness present. No swelling.     Cervical back: Normal range of motion and neck supple. No tenderness.     Comments: Tenderness to the right trapezius muscle, right subscapularis area pain worse with movement, no midline spinal tenderness.  Skin:    General: Skin is warm and dry.     Capillary Refill: Capillary refill takes less than 2 seconds.  Neurological:     General: No focal deficit present.     Mental Status: She is alert and oriented to person, place, and time.     Cranial Nerves: No cranial nerve deficit.     Sensory: No sensory deficit.     Motor: No weakness.     Coordination: Coordination normal.     Comments: 5+ out of 5 strength, normal sensation  Psychiatric:  Mood and Affect: Mood normal.     (all labs ordered are listed, but only abnormal results are displayed) Labs Reviewed - No data to display  EKG: None  Radiology: DG Shoulder Right Result Date: 07/31/2024 CLINICAL DATA:  Right shoulder pain. EXAM: RIGHT SHOULDER - 2+ VIEW COMPARISON:  None Available. FINDINGS: There is no evidence of fracture or dislocation. The alignment and joint spaces are normal. There is no evidence of arthropathy or other focal bone abnormality. Soft tissues are unremarkable. IMPRESSION: Negative radiographs of the right shoulder. Electronically Signed   By: Andrea Gasman M.D.   On: 07/31/2024 15:02     Procedures   Medications Ordered in the ED  dexamethasone  (DECADRON ) tablet 10 mg (10 mg Oral Given 07/31/24 1732)   ketorolac  (TORADOL ) injection 60 mg (60 mg Intramuscular Given 07/31/24 1728)                                    Medical Decision Making Risk Prescription drug management.   Kathleen Cain is here with right shoulder pain.  No significant medical history.  Here with reproducible right shoulder pain.  Pain mostly in the right trapezius right subscapularis area.  She has some tingling down her right arms at times.  No neck pain.  Normal strength and sensation.  Differential diagnosis likely inflammatory process/radiculopathy.  X-ray of the right shoulder was unremarkable per radiology report.  She is neurovascular neuromuscular intact on exam.  Have no concern for spinal cord issue.  EKG shows sinus rhythm.  No ischemic changes.  She has no chest pain.  No shortness of breath.  She is PERC negative.  No cardiac risk factors.  I have no concern for ACS PE or other acute process.  I do think that this is musculoskeletal in nature.  She is given Decadron  and Toradol  shot in the ED.  Will prescribe Medrol  Dosepak for home have her follow-up with primary care/orthopedics.  Discharged in good condition.  Understands return precautions.  This chart was dictated using voice recognition software.  Despite best efforts to proofread,  errors can occur which can change the documentation meaning.      Final diagnoses:  Radiculopathy, unspecified spinal region  Acute pain of right shoulder    ED Discharge Orders          Ordered    methylPREDNISolone  (MEDROL  DOSEPAK) 4 MG TBPK tablet        07/31/24 1739               Jamilah Jean, DO 07/31/24 1747

## 2024-07-31 NOTE — ED Triage Notes (Signed)
 Pt states bib POV c/o right shoulder pain that started a week ago. Pt denies injuring the arm.  Pt endorses sharp and numbness sensation that travels from finger tips to right scapula.

## 2024-07-31 NOTE — ED Notes (Signed)
 ED Provider at bedside.

## 2024-07-31 NOTE — ED Notes (Signed)
 Pt verbalized understanding of discharge instructions. Opportunity to ask questions if needed.

## 2024-08-27 ENCOUNTER — Other Ambulatory Visit: Payer: Self-pay

## 2024-08-27 ENCOUNTER — Ambulatory Visit: Attending: Orthopedic Surgery | Admitting: Physical Therapy

## 2024-08-27 ENCOUNTER — Encounter: Payer: Self-pay | Admitting: Physical Therapy

## 2024-08-27 DIAGNOSIS — M542 Cervicalgia: Secondary | ICD-10-CM | POA: Diagnosis present

## 2024-08-27 NOTE — Therapy (Signed)
 OUTPATIENT PHYSICAL THERAPY SHOULDER EVALUATION   Patient Name: Kathleen Cain MRN: [id number] DOB:1991-07-27, 33 y.o., female Today's Date: 08/27/2024   PT End of Session - 08/27/24 0925     Visit Number 1    Number of Visits --   1-2x/week   Date for PT Re-Evaluation 10/22/24    Authorization Type [employer] MCD    PT Start Time 0852    PT Stop Time 0930    PT Time Calculation (min) 38 min          Past Medical History:  Diagnosis Date   History of pre-eclampsia in prior pregnancy, currently pregnant    Past Surgical History:  Procedure Laterality Date   NO PAST SURGERIES     Patient Active Problem List   Diagnosis Date Noted   Vaginal delivery 10/19/2022   Abnormal fetal ultrasound 10/18/2022   Large for gestational age fetus affecting management of mother 10/17/2022    PCP: Patient, No Pcp Per  REFERRING PROVIDER: Dozier Soulier, MD  THERAPY DIAG:  Cervicalgia - Plan: PT plan of care cert/re-cert  REFERRING DIAG: Neck pain  Rationale for Evaluation and Treatment:  Rehabilitation  SUBJECTIVE:  PERTINENT PAST HISTORY:  None        PRECAUTIONS: None  WEIGHT BEARING RESTRICTIONS No  FALLS:  Has patient fallen in last 6 months? No, Number of falls: 0  MOI/History of condition:  Onset date: 1-2 months  SUBJECTIVE STATEMENT  Pt is a 33 y.o. female who presents to clinic with chief complaint of R sided periscapular and neck pain with some numbness in thumb and R index finger.  The pain was very high at first but has improved somewhat but the n/t in fingers remains.  Denies L UE sxs.  N/t in fingers is constant.  Started suddenly with no clear cause.  No trauma or acute injury.  Denies gross weakness in R UE   Red flags:  denies   Pain:  Are you having pain? Yes Pain location: periscapular region R UE, R periscapular, R neck NPRS scale:  Best: 6/10, Worst: 10/10 Aggravating factors: no clear agging factors Relieving factors: no clear  easing factors Pain description: burning  Occupation: NA  Assistive Device: NA  Hand Dominance: R handed  Patient Goals/Specific Activities: reduce pain, be able to lfit daughter   OBJECTIVE:   DIAGNOSTIC FINDINGS:  None available in epic  GENERAL OBSERVATION: Forward head     SENSATION: Light touch: Deficits thumb and index finger R hand   PALPATION: TTP lower cervical spine C5 - C7  Cervical ROM  ROM ROM  (Eval)  Flexion 50*  Extension 50  Right lateral flexion 45*  Left lateral flexion 40  Right rotation 80  Left rotation 80*  Flexion rotation (normal is 30 degrees)   Flexion rotation (normal is 30 degrees)     (Blank rows = not tested, N = WNL, * = concordant pain)  UPPER EXTREMITY MMT:  MMT Right (Eval) Left (Eval)  Shoulder flexion    Shoulder abduction (C5) c c  Shoulder ER    Shoulder IR    Middle trapezius    Lower trapezius    Shoulder extension    Grip strength    Shoulder shrug (C4) c c  Elbow flexion (C6) c c  Elbow ext (C7) c c  Thumb ext (C8) c c  Finger abd (T1) c c  Grossly     (Blank rows = not tested, score listed is out of 5  possible points.  N = WNL, D = diminished, C = clear for gross weakness with myotome testing, * = concordant pain with testing)   SPECIAL TESTS:  Spurling's: (+), ULTTA (+)  JOINT MOBILITY TESTING:  Reproduction of sxs with PA lower cervical spine  PATIENT SURVEYS:  NDI: 24/50    TODAY'S TREATMENT:  Therapeutic Exercise: Creating, reviewing, and completing below HEP    PATIENT EDUCATION ([place]/HM):  POC, diagnosis, prognosis, HEP, and outcome measures.  Pt educated via explanation, demonstration, and handout (HEP).  Pt confirms understanding verbally.    HOME EXERCISE PROGRAM: Access Code: [facility] URL: [email or web address] Date: 08/27/2024 Prepared by: Helene Gasmen  Exercises - Seated Cervical Retraction  - 3 x daily - 7 x weekly - 1 sets - 10 reps - 5'' hold  Treatment  priorities   Eval        Traction?        No clear directional preference or relieving movements on exam                                  ASSESSMENT:  CLINICAL IMPRESSION: Kathleen Cain is a 33 y.o. female who presents to clinic with signs and sxs consistent with R sided cervical radiculopathy originating from lower cervical spine (C6).   Kathleen Cain will benefit from skilled PT to address relevant deficits and improve comfort with daily tasks such as child care, sleep, and housework.   OBJECTIVE IMPAIRMENTS: Pain, cervical ROM  ACTIVITY LIMITATIONS: lifting, reaching, childcare, housework  PERSONAL FACTORS: See medical history and pertinent history   REHAB POTENTIAL: Good  CLINICAL DECISION MAKING: Evolving/moderate complexity  EVALUATION COMPLEXITY: Moderate   GOALS:   SHORT TERM GOALS: Target date: 09/24/2024  Kathleen Cain will be >75% HEP compliant to improve carryover between sessions and facilitate independent management of condition  Evaluation: ongoing Goal status: INITIAL   LONG TERM GOALS: Target date: 10/22/2024   Kathleen Cain will self report >/= 50% decrease in pain from evaluation to improve function in daily tasks  Evaluation/Baseline: 10/10 max pain Goal status: INITIAL   2.  Kathleen Cain will show a >/= 10 pt improvement in their NDI score as a proxy for functional improvement  Evaluation/Baseline: 24/50 pts Goal status: INITIAL   3.  Kathleen Cain will be able to Lift 33 year old with R arm, not limited by pain  Evaluation/Baseline: limited Goal status: INITIAL   4.  Kathleen Cain will report confidence in self management of condition at time of discharge with advanced HEP  Evaluation/Baseline: unable to self manage Goal status: INITIAL    PLAN: PT FREQUENCY: 1-2x/week  PT DURATION: 8 weeks  PLANNED INTERVENTIONS:  97164- PT Re-evaluation, 97110-Therapeutic exercises, 97530- Therapeutic activity, 97112- Neuromuscular re-education, 97535- Self Care,  97140- Manual therapy, [id number]- Gait training, 02886- Aquatic Therapy, [id number]- Electrical stimulation (manual), [id number]- Vasopneumatic device, [id number]- Traction (mechanical), [id number]- Ionotophoresis 4mg /ml Dexamethasone , Taping, Dry Needling, Joint manipulation, and Spinal manipulation.   Helene Gasmen PT, DPT 08/27/2024, 10:23 AM  I just finished a MCD eval/recert.  Name: Kathleen Cain  MRN: [id number] Please request 2x/week for 8 weeks.  Check all conditions that are expected to impact treatment: Musculoskeletal disorders   I DID put a charge in.  Check all possible CPT codes: 02889- Therapeutic Exercise, [phone]- Neuro Re-education, [phone] - Gait Training, [phone] - Manual Therapy, 97530 - Therapeutic Activities, 97535 - Self Care, [phone] - Re-evaluation, [id number] - Mechanical traction, and [id number] - Aquatic therapy  Thank you!  MCD - Secure

## 2024-09-07 ENCOUNTER — Ambulatory Visit: Admitting: Physical Therapy

## 2024-09-07 ENCOUNTER — Encounter: Payer: Self-pay | Admitting: Physical Therapy

## 2024-09-07 DIAGNOSIS — M542 Cervicalgia: Secondary | ICD-10-CM | POA: Diagnosis not present

## 2024-09-07 NOTE — Therapy (Signed)
 OUTPATIENT PHYSICAL THERAPY SHOULDER EVALUATION   Patient Name: Kathleen Cain MRN: [id number] DOB:Jun 20, 1991, 33 y.o., female Today's Date: 09/07/2024   PT End of Session - 09/07/24 1151     Visit Number 2    Number of Visits --   1-2x/week   Date for Recertification  10/22/24    Authorization Type [employer]    PT Start Time 1149    PT Stop Time 1227    PT Time Calculation (min) 38 min          Past Medical History:  Diagnosis Date   History of pre-eclampsia in prior pregnancy, currently pregnant    Past Surgical History:  Procedure Laterality Date   NO PAST SURGERIES     Patient Active Problem List   Diagnosis Date Noted   Vaginal delivery 10/19/2022   Abnormal fetal ultrasound 10/18/2022   Large for gestational age fetus affecting management of mother 10/17/2022    PCP: Patient, No Pcp Per  REFERRING PROVIDER: Dozier Soulier, MD  THERAPY DIAG:  Cervicalgia  REFERRING DIAG: Neck pain  Rationale for Evaluation and Treatment:  Rehabilitation  SUBJECTIVE:  PERTINENT PAST HISTORY:  None        PRECAUTIONS: None  WEIGHT BEARING RESTRICTIONS No  FALLS:  Has patient fallen in last 6 months? No, Number of falls: 0  MOI/History of condition:  Onset date: 1-2 months  SUBJECTIVE STATEMENT  Pt is a 33 y.o. female who presents to clinic with chief complaint of R sided periscapular and neck pain with some numbness in thumb and R index finger.  The pain was very high at first but has improved somewhat but the n/t in fingers remains.  Denies L UE sxs.  N/t in fingers is constant.  Started suddenly with no clear cause.  No trauma or acute injury.  Denies gross weakness in R UE   Red flags:  denies   Pain:  Are you having pain? Yes Pain location: periscapular region R UE, R periscapular, R neck NPRS scale:  Best: 6/10, Worst: 10/10 Aggravating factors: no clear agging factors Relieving factors: no clear easing factors Pain description:  burning  Occupation: NA  Assistive Device: NA  Hand Dominance: R handed  Patient Goals/Specific Activities: reduce pain, be able to lfit daughter   OBJECTIVE:   DIAGNOSTIC FINDINGS:  None available in epic  GENERAL OBSERVATION: Forward head     SENSATION: Light touch: Deficits thumb and index finger R hand   PALPATION: TTP lower cervical spine C5 - C7  Cervical ROM  ROM ROM  (Eval)  Flexion 50*  Extension 50  Right lateral flexion 45*  Left lateral flexion 40  Right rotation 80  Left rotation 80*  Flexion rotation (normal is 30 degrees)   Flexion rotation (normal is 30 degrees)     (Blank rows = not tested, N = WNL, * = concordant pain)  UPPER EXTREMITY MMT:  MMT Right (Eval) Left (Eval)  Shoulder flexion    Shoulder abduction (C5) c c  Shoulder ER    Shoulder IR    Middle trapezius    Lower trapezius    Shoulder extension    Grip strength    Shoulder shrug (C4) c c  Elbow flexion (C6) c c  Elbow ext (C7) c c  Thumb ext (C8) c c  Finger abd (T1) c c  Grossly     (Blank rows = not tested, score listed is out of 5 possible points.  N = WNL, D =  diminished, C = clear for gross weakness with myotome testing, * = concordant pain with testing)   SPECIAL TESTS:  Spurling's: (+), ULTTA (+)  JOINT MOBILITY TESTING:  Reproduction of sxs with PA lower cervical spine  PATIENT SURVEYS:  NDI: 24/50    TODAY'S TREATMENT:  [facility] Adult PT Treatment:                                                DATE: 09/07/24 Therapeutic Exercise: Seated chin tuck x 10 GTB Row 10 x 2 cues for neutral spine Red band pull down 10 x 2 - cues for neutral spine Seated levator stretch  Wall angels x 10  Back on wall- PPT  Updated HEP Manual Therapy: Manual cervical traction Passive UT stretch with over pressure to right shoulder-increased pain reduced with TPR TPR right upper trap     EVAL:  Therapeutic Exercise: Creating, reviewing, and completing below  HEP    PATIENT EDUCATION ([place]/HM):  POC, diagnosis, prognosis, HEP, and outcome measures.  Pt educated via explanation, demonstration, and handout (HEP).  Pt confirms understanding verbally.    HOME EXERCISE PROGRAM: Access Code: [facility] URL: [email or web address] Date: 08/27/2024 Prepared by: Helene Gasmen  Exercises - Seated Cervical Retraction  - 3 x daily - 7 x weekly - 1 sets - 10 reps - 5'' hold Added 09/07/24 - Standing Row with Anchored Resistance  - 1 x daily - 7 x weekly - 2-3 sets - 10 reps - Seated Levator Scapulae Stretch  - 1 x daily - 7 x weekly - 1 sets - 2-3 reps - 20-30 hold  Treatment priorities   Eval        Traction?        No clear directional preference or relieving movements on exam                                  ASSESSMENT:  CLINICAL IMPRESSION: Pt reports compliance with HEP and improvement in pain. When she feels neck and arm pain she performs chin tucks which reduces her pain within minutes. Still having numbess in 1-2 digits. Reviewed HEP and progressed with postural strengthening and levator stretches. Manual performed to decreased right upper trap/levator tension. She required cues for thoracolumbar alignment with standing therex, tend to stand in sway back. Used wall to engage abdominals and reduce lordosis.    EVAL: Kathleen Cain is a 33 y.o. female who presents to clinic with signs and sxs consistent with R sided cervical radiculopathy originating from lower cervical spine (C6).   Kathleen Cain will benefit from skilled PT to address relevant deficits and improve comfort with daily tasks such as child care, sleep, and housework.   OBJECTIVE IMPAIRMENTS: Pain, cervical ROM  ACTIVITY LIMITATIONS: lifting, reaching, childcare, housework  PERSONAL FACTORS: See medical history and pertinent history   REHAB POTENTIAL: Good  CLINICAL DECISION MAKING: Evolving/moderate complexity  EVALUATION COMPLEXITY:  Moderate   GOALS:   SHORT TERM GOALS: Target date: 09/24/2024  Kathleen Cain will be >75% HEP compliant to improve carryover between sessions and facilitate independent management of condition  Evaluation: ongoing Goal status: INITIAL   LONG TERM GOALS: Target date: 10/22/2024   Kathleen Cain will self report >/= 50% decrease in pain from evaluation to improve function in daily tasks  Evaluation/Baseline: 10/10 max pain  Goal status: INITIAL   2.  Kathleen Cain will show a >/= 10 pt improvement in their NDI score as a proxy for functional improvement  Evaluation/Baseline: 24/50 pts Goal status: INITIAL   3.  Kathleen Cain will be able to Lift 33 year old with R arm, not limited by pain  Evaluation/Baseline: limited Goal status: INITIAL   4.  Kathleen Cain will report confidence in self management of condition at time of discharge with advanced HEP  Evaluation/Baseline: unable to self manage Goal status: INITIAL    PLAN: PT FREQUENCY: 1-2x/week  PT DURATION: 8 weeks  PLANNED INTERVENTIONS:  97164- PT Re-evaluation, 97110-Therapeutic exercises, 97530- Therapeutic activity, 97112- Neuromuscular re-education, 97535- Self Care, 02859- Manual therapy, [id number]- Gait training, [id number]- Aquatic Therapy, [id number]- Electrical stimulation (manual), [id number]- Vasopneumatic device, [id number]- Traction (mechanical), [id number]- Ionotophoresis 4mg /ml Dexamethasone , Taping, Dry Needling, Joint manipulation, and Spinal manipulation.   Harlene Persons, PTA 09/07/24 2:15 PM Phone: [phone] Fax: [phone]   I just finished a MCD eval/recert.  Name: Kathleen Cain  MRN: [id number] Please request 2x/week for 8 weeks.  Check all conditions that are expected to impact treatment: Musculoskeletal disorders   I DID put a charge in.  Check all possible CPT codes: 02889- Therapeutic Exercise, [phone]- Neuro Re-education, [phone] - Gait Training, [phone] - Manual Therapy, 97530 - Therapeutic Activities, 97535 - Self  Care, [phone] - Re-evaluation, [id number] - Mechanical traction, and [id number] - Aquatic therapy   Thank you!  MCD - Secure

## 2024-09-11 ENCOUNTER — Encounter: Payer: Self-pay | Admitting: Physical Therapy

## 2024-09-11 ENCOUNTER — Ambulatory Visit: Admitting: Physical Therapy

## 2024-09-11 DIAGNOSIS — M542 Cervicalgia: Secondary | ICD-10-CM | POA: Diagnosis not present

## 2024-09-11 NOTE — Therapy (Signed)
 OUTPATIENT PHYSICAL THERAPY SHOULDER TREATMENT   Patient Name: Kathleen Cain MRN: [id number] DOB:12/22/1990, 33 y.o., female Today's Date: 09/11/2024   PT End of Session - 09/11/24 0845     Visit Number 3    Number of Visits --   1-2x/week   Date for Recertification  10/22/24    Authorization Type [employer]    Authorization Time Period 08/27/24-10/26/24    Authorization - Visit Number 3    Authorization - Number of Visits 10    PT Start Time 0847    PT Stop Time 0926    PT Time Calculation (min) 39 min          Past Medical History:  Diagnosis Date   History of pre-eclampsia in prior pregnancy, currently pregnant    Past Surgical History:  Procedure Laterality Date   NO PAST SURGERIES     Patient Active Problem List   Diagnosis Date Noted   Vaginal delivery 10/19/2022   Abnormal fetal ultrasound 10/18/2022   Large for gestational age fetus affecting management of mother 10/17/2022    PCP: Patient, No Pcp Per  REFERRING PROVIDER: Dozier Soulier, MD  THERAPY DIAG:  Cervicalgia  REFERRING DIAG: Neck pain  Rationale for Evaluation and Treatment:  Rehabilitation  SUBJECTIVE:  PERTINENT PAST HISTORY:  None        PRECAUTIONS: None  WEIGHT BEARING RESTRICTIONS No  FALLS:  Has patient fallen in last 6 months? No, Number of falls: 0  MOI/History of condition:  Onset date: 1-2 months  SUBJECTIVE STATEMENT 09/11/24: Pt reports increased right neck/ upper trap pain after completing standing row exercise that last an hour and then resolves. Has continued to complete the exercise despite the pain. No pain at the moment. Continued numbness in digits 1 and 2.    Pt is a 33 y.o. female who presents to clinic with chief complaint of R sided periscapular and neck pain with some numbness in thumb and R index finger.  The pain was very high at first but has improved somewhat but the n/t in fingers remains.  Denies L UE sxs.  N/t in fingers is constant.   Started suddenly with no clear cause.  No trauma or acute injury.  Denies gross weakness in R UE   Red flags:  denies   Pain:  Are you having pain? Yes Pain location: periscapular region R UE, R periscapular, R neck NPRS scale:  Best: 6/10, Worst: 10/10 Aggravating factors: no clear agging factors Relieving factors: no clear easing factors Pain description: burning  Occupation: NA  Assistive Device: NA  Hand Dominance: R handed  Patient Goals/Specific Activities: reduce pain, be able to lfit daughter   OBJECTIVE:   DIAGNOSTIC FINDINGS:  None available in epic  GENERAL OBSERVATION: Forward head     SENSATION: Light touch: Deficits thumb and index finger R hand   PALPATION: TTP lower cervical spine C5 - C7  Cervical ROM  ROM ROM  (Eval)  Flexion 50*  Extension 50  Right lateral flexion 45*  Left lateral flexion 40  Right rotation 80  Left rotation 80*  Flexion rotation (normal is 30 degrees)   Flexion rotation (normal is 30 degrees)     (Blank rows = not tested, N = WNL, * = concordant pain)  UPPER EXTREMITY MMT:  MMT Right (Eval) Left (Eval)  Shoulder flexion    Shoulder abduction (C5) c c  Shoulder ER    Shoulder IR    Middle trapezius  Lower trapezius    Shoulder extension    Grip strength    Shoulder shrug (C4) c c  Elbow flexion (C6) c c  Elbow ext (C7) c c  Thumb ext (C8) c c  Finger abd (T1) c c  Grossly     (Blank rows = not tested, score listed is out of 5 possible points.  N = WNL, D = diminished, C = clear for gross weakness with myotome testing, * = concordant pain with testing)   SPECIAL TESTS:  Spurling's: (+), ULTTA (+)  JOINT MOBILITY TESTING:  Reproduction of sxs with PA lower cervical spine  PATIENT SURVEYS:  NDI: 24/50    TODAY'S TREATMENT:  [facility] Adult PT Treatment:                                                DATE: 09/11/24 Therapeutic Exercise: Seated chin tuck  Levator stretch bilat  Review of Standing  row - min cues  Supine red band horiz abdct 10 x 2 - with chin tuck Supine diagonal pulls 5 x 2 each - with chin tuck  90/90 lift and lower x 10 Chin tuck with UE flex/ext alternating  Dead Bug Level 1  Update HEP Manual Therapy STW and TPR to right upper trap/levator Manual cervical traction Passive UT stretch with over pressure to right shoulder     [facility] Adult PT Treatment:                                                DATE: 09/07/24 Therapeutic Exercise: Seated chin tuck x 10 GTB Row 10 x 2 cues for neutral spine Red band pull down 10 x 2 - cues for neutral spine Seated levator stretch  Wall angels x 10  Back on wall- PPT  Updated HEP Manual Therapy: Manual cervical traction Passive UT stretch with over pressure to right shoulder-increased pain reduced with TPR TPR right upper trap     EVAL:  Therapeutic Exercise: Creating, reviewing, and completing below HEP    PATIENT EDUCATION ([place]/HM):  POC, diagnosis, prognosis, HEP, and outcome measures.  Pt educated via explanation, demonstration, and handout (HEP).  Pt confirms understanding verbally.    HOME EXERCISE PROGRAM: Access Code: [facility] URL: [email or web address] Date: 08/27/2024 Prepared by: Helene Gasmen  Exercises - Seated Cervical Retraction  - 3 x daily - 7 x weekly - 1 sets - 10 reps - 5'' hold Added 09/07/24 DISCONTINUE- Standing Row with Anchored Resistance  - 1 x daily - 7 x weekly - 2-3 sets - 10 reps - Seated Levator Scapulae Stretch  - 1 x daily - 7 x weekly - 1 sets - 2-3 reps - 20-30 hold Added 09/11/24: - Supine Shoulder Horizontal Abduction with Resistance  - 1 x daily - 7 x weekly - 2 sets - 10 reps - Alternating diagonals -( lay down to perform)   - 1 x daily - 7 x weekly - 2 sets - 10 reps   Treatment priorities   Eval        Traction?        No clear directional preference or relieving movements on exam  ASSESSMENT:  CLINICAL  IMPRESSION: 09/11/24: Pt reports compliance with HEP and increased pain after shoulder rows. She does have a sway back posture which could contribute. The rows were discontinued and HEP was updated to include supine scapular bands while maintaining neutral spine. STW and TPR performed to right upper trap/levator. She did have a palpable muscle spasm which was softened and smoothed with stated manual. Will assess response to manual and change in HEP at next session.    09/07/24: Pt reports compliance with HEP and improvement in pain. When she feels neck and arm pain she performs chin tucks which reduces her pain within minutes. Still having numbess in 1-2 digits. Reviewed HEP and progressed with postural strengthening and levator stretches. Manual performed to decreased right upper trap/levator tension. She required cues for thoracolumbar alignment with standing therex, tend to stand in sway back. Used wall to engage abdominals and reduce lordosis.    EVAL: Ineta is a 33 y.o. female who presents to clinic with signs and sxs consistent with R sided cervical radiculopathy originating from lower cervical spine (C6).   Ginna will benefit from skilled PT to address relevant deficits and improve comfort with daily tasks such as child care, sleep, and housework.   OBJECTIVE IMPAIRMENTS: Pain, cervical ROM  ACTIVITY LIMITATIONS: lifting, reaching, childcare, housework  PERSONAL FACTORS: See medical history and pertinent history   REHAB POTENTIAL: Good  CLINICAL DECISION MAKING: Evolving/moderate complexity  EVALUATION COMPLEXITY: Moderate   GOALS:   SHORT TERM GOALS: Target date: 09/24/2024  Kaylea will be >75% HEP compliant to improve carryover between sessions and facilitate independent management of condition  Evaluation: ongoing Goal status: INITIAL   LONG TERM GOALS: Target date: 10/22/2024   Arleta will self report >/= 50% decrease in pain from evaluation to improve  function in daily tasks  Evaluation/Baseline: 10/10 max pain Goal status: INITIAL   2.  Vollie will show a >/= 10 pt improvement in their NDI score as a proxy for functional improvement  Evaluation/Baseline: 24/50 pts Goal status: INITIAL   3.  Mirinda will be able to Lift 33 year old with R arm, not limited by pain  Evaluation/Baseline: limited Goal status: INITIAL   4.  Marrah will report confidence in self management of condition at time of discharge with advanced HEP  Evaluation/Baseline: unable to self manage Goal status: INITIAL    PLAN: PT FREQUENCY: 1-2x/week  PT DURATION: 8 weeks  PLANNED INTERVENTIONS:  97164- PT Re-evaluation, 97110-Therapeutic exercises, 97530- Therapeutic activity, 97112- Neuromuscular re-education, 97535- Self Care, 02859- Manual therapy, [id number]- Gait training, [id number]- Aquatic Therapy, [id number]- Electrical stimulation (manual), [id number]- Vasopneumatic device, [id number]- Traction (mechanical), [id number]- Ionotophoresis 4mg /ml Dexamethasone , Taping, Dry Needling, Joint manipulation, and Spinal manipulation.   Harlene Persons, PTA 09/11/24 10:04 AM Phone: [phone] Fax: [phone]   I just finished a MCD eval/recert.  Name: Jnae Thomaston  MRN: [id number] Please request 2x/week for 8 weeks.  Check all conditions that are expected to impact treatment: Musculoskeletal disorders   I DID put a charge in.  Check all possible CPT codes: 02889- Therapeutic Exercise, [phone]- Neuro Re-education, [phone] - Gait Training, [id number] - Manual Therapy, 97530 - Therapeutic Activities, 97535 - Self Care, [phone] - Re-evaluation, [id number] - Mechanical traction, and [id number] - Aquatic therapy   Thank you!  MCD - Secure

## 2024-09-13 ENCOUNTER — Encounter: Payer: Self-pay | Admitting: Physical Therapy

## 2024-09-13 ENCOUNTER — Ambulatory Visit: Admitting: Physical Therapy

## 2024-09-13 DIAGNOSIS — M542 Cervicalgia: Secondary | ICD-10-CM | POA: Diagnosis not present

## 2024-09-13 NOTE — Therapy (Signed)
 OUTPATIENT PHYSICAL THERAPY SHOULDER TREATMENT   Patient Name: Kathleen Cain MRN: [id number] DOB:May 24, 1991, 33 y.o., female Today's Date: 09/13/2024   PT End of Session - 09/13/24 1146     Visit Number 4    Number of Visits --   1-2x/week   Date for Recertification  10/22/24    Authorization Type [employer]    Authorization Time Period 08/27/24-10/26/24    Authorization - Visit Number 4    Authorization - Number of Visits 10    PT Start Time 1146    PT Stop Time 1227    PT Time Calculation (min) 41 min          Past Medical History:  Diagnosis Date   History of pre-eclampsia in prior pregnancy, currently pregnant    Past Surgical History:  Procedure Laterality Date   NO PAST SURGERIES     Patient Active Problem List   Diagnosis Date Noted   Vaginal delivery 10/19/2022   Abnormal fetal ultrasound 10/18/2022   Large for gestational age fetus affecting management of mother 10/17/2022    PCP: Patient, No Pcp Per  REFERRING PROVIDER: Dozier Soulier, MD  THERAPY DIAG:  Cervicalgia  REFERRING DIAG: Neck pain  Rationale for Evaluation and Treatment:  Rehabilitation  SUBJECTIVE:  PERTINENT PAST HISTORY:  None        PRECAUTIONS: None  WEIGHT BEARING RESTRICTIONS No  FALLS:  Has patient fallen in last 6 months? No, Number of falls: 0  MOI/History of condition:  Onset date: 1-2 months  SUBJECTIVE STATEMENT 09/11/24: Pt reports improvement in her neck and UE sxs   Pt is a 33 y.o. female who presents to clinic with chief complaint of R sided periscapular and neck pain with some numbness in thumb and R index finger.  The pain was very high at first but has improved somewhat but the n/t in fingers remains.  Denies L UE sxs.  N/t in fingers is constant.  Started suddenly with no clear cause.  No trauma or acute injury.  Denies gross weakness in R UE   Red flags:  denies   Pain:  Are you having pain? Yes Pain location: periscapular region R UE,  R periscapular, R neck NPRS scale:  Best: 6/10, Worst: 10/10 Aggravating factors: no clear agging factors Relieving factors: no clear easing factors Pain description: burning  Occupation: NA  Assistive Device: NA  Hand Dominance: R handed  Patient Goals/Specific Activities: reduce pain, be able to lfit daughter   OBJECTIVE:   DIAGNOSTIC FINDINGS:  None available in epic  GENERAL OBSERVATION: Forward head     SENSATION: Light touch: Deficits thumb and index finger R hand   PALPATION: TTP lower cervical spine C5 - C7  Cervical ROM  ROM ROM  (Eval)  Flexion 50*  Extension 50  Right lateral flexion 45*  Left lateral flexion 40  Right rotation 80  Left rotation 80*  Flexion rotation (normal is 30 degrees)   Flexion rotation (normal is 30 degrees)     (Blank rows = not tested, N = WNL, * = concordant pain)  UPPER EXTREMITY MMT:  MMT Right (Eval) Left (Eval)  Shoulder flexion    Shoulder abduction (C5) c c  Shoulder ER    Shoulder IR    Middle trapezius    Lower trapezius    Shoulder extension    Grip strength    Shoulder shrug (C4) c c  Elbow flexion (C6) c c  Elbow ext (C7) c c  Thumb ext (C8) c c  Finger abd (T1) c c  Grossly     (Blank rows = not tested, score listed is out of 5 possible points.  N = WNL, D = diminished, C = clear for gross weakness with myotome testing, * = concordant pain with testing)   SPECIAL TESTS:  Spurling's: (+), ULTTA (+)  JOINT MOBILITY TESTING:  Reproduction of sxs with PA lower cervical spine  PATIENT SURVEYS:  NDI: 24/50    TODAY'S TREATMENT:  [facility] Adult PT Treatment:                                                DATE: 09/13/24 Therapeutic Exercise: supine chin tuck - 2x10 - 5'' hold W/ lift - x6 Review of Standing row - GTB - 3x5 Standing shoulder ext - 2x10 - RTB Supine red band horiz abdct 15 x 2 - with chin tuck 90/90 lift and lower 3x 30'' Chin tuck with UE flex/ext alternating - w/ RTB Dead Bug  Level 2x8 ea   Manual Therapy STW and TPR to right upper trap/levator Manual cervical traction Chin tuck with OP L/R/C     [facility] Adult PT Treatment:                                                DATE: 09/07/24 Therapeutic Exercise: Seated chin tuck x 10 GTB Row 10 x 2 cues for neutral spine Red band pull down 10 x 2 - cues for neutral spine Seated levator stretch  Wall angels x 10  Back on wall- PPT  Updated HEP Manual Therapy: Manual cervical traction Passive UT stretch with over pressure to right shoulder-increased pain reduced with TPR TPR right upper trap     EVAL:  Therapeutic Exercise: Creating, reviewing, and completing below HEP    PATIENT EDUCATION ([place]/HM):  POC, diagnosis, prognosis, HEP, and outcome measures.  Pt educated via explanation, demonstration, and handout (HEP).  Pt confirms understanding verbally.    HOME EXERCISE PROGRAM: Access Code: [facility] URL: [email or web address] Date: 08/27/2024 Prepared by: Helene Gasmen  Exercises - Seated Cervical Retraction  - 3 x daily - 7 x weekly - 1 sets - 10 reps - 5'' hold Added 09/07/24 DISCONTINUE- Standing Row with Anchored Resistance  - 1 x daily - 7 x weekly - 2-3 sets - 10 reps - Seated Levator Scapulae Stretch  - 1 x daily - 7 x weekly - 1 sets - 2-3 reps - 20-30 hold Added 09/11/24: - Supine Shoulder Horizontal Abduction with Resistance  - 1 x daily - 7 x weekly - 2 sets - 10 reps - Alternating diagonals -( lay down to perform)   - 1 x daily - 7 x weekly - 2 sets - 10 reps   Treatment priorities   Eval        Traction?        No clear directional preference or relieving movements on exam                                  ASSESSMENT:  CLINICAL IMPRESSION: 09/11/24: Pt reports continued improvement in neck pain and n/t in R  hand.  No pain reported with row and shoulder ext today.  Will continue to progress therex as tolerated.   09/07/24: Pt reports compliance with HEP and  improvement in pain. When she feels neck and arm pain she performs chin tucks which reduces her pain within minutes. Still having numbess in 1-2 digits. Reviewed HEP and progressed with postural strengthening and levator stretches. Manual performed to decreased right upper trap/levator tension. She required cues for thoracolumbar alignment with standing therex, tend to stand in sway back. Used wall to engage abdominals and reduce lordosis.    EVAL: Kathleen Cain is a 33 y.o. female who presents to clinic with signs and sxs consistent with R sided cervical radiculopathy originating from lower cervical spine (C6).   Cledith will benefit from skilled PT to address relevant deficits and improve comfort with daily tasks such as child care, sleep, and housework.   OBJECTIVE IMPAIRMENTS: Pain, cervical ROM  ACTIVITY LIMITATIONS: lifting, reaching, childcare, housework  PERSONAL FACTORS: See medical history and pertinent history   REHAB POTENTIAL: Good  CLINICAL DECISION MAKING: Evolving/moderate complexity  EVALUATION COMPLEXITY: Moderate   GOALS:   SHORT TERM GOALS: Target date: 09/24/2024  Georgeanna will be >75% HEP compliant to improve carryover between sessions and facilitate independent management of condition  Evaluation: ongoing Goal status: INITIAL   LONG TERM GOALS: Target date: 10/22/2024   Lovelle will self report >/= 50% decrease in pain from evaluation to improve function in daily tasks  Evaluation/Baseline: 10/10 max pain Goal status: INITIAL   2.  Jeanne will show a >/= 10 pt improvement in their NDI score as a proxy for functional improvement  Evaluation/Baseline: 24/50 pts Goal status: INITIAL   3.  Santiana will be able to Lift 33 year old with R arm, not limited by pain  Evaluation/Baseline: limited Goal status: INITIAL   4.  Evamarie will report confidence in self management of condition at time of discharge with advanced HEP  Evaluation/Baseline:  unable to self manage Goal status: INITIAL    PLAN: PT FREQUENCY: 1-2x/week  PT DURATION: 8 weeks  PLANNED INTERVENTIONS:  97164- PT Re-evaluation, 97110-Therapeutic exercises, 97530- Therapeutic activity, 97112- Neuromuscular re-education, 97535- Self Care, 02859- Manual therapy, [id number]- Gait training, [id number]- Aquatic Therapy, [id number]- Electrical stimulation (manual), [id number]- Vasopneumatic device, [id number]- Traction (mechanical), [id number]- Ionotophoresis 4mg /ml Dexamethasone , Taping, Dry Needling, Joint manipulation, and Spinal manipulation.   Harlene Persons, PTA 09/13/24 12:37 PM Phone: [phone] Fax: [phone]   I just finished a MCD eval/recert.  Name: Lorine Iannaccone  MRN: [id number] Please request 2x/week for 8 weeks.  Check all conditions that are expected to impact treatment: Musculoskeletal disorders   I DID put a charge in.  Check all possible CPT codes: 02889- Therapeutic Exercise, [phone]- Neuro Re-education, [id number] - Gait Training, [phone] - Manual Therapy, 97530 - Therapeutic Activities, 97535 - Self Care, [phone] - Re-evaluation, [id number] - Mechanical traction, and [id number] - Aquatic therapy   Thank you!  MCD - Secure

## 2024-09-18 ENCOUNTER — Ambulatory Visit: Admitting: Physical Therapy

## 2024-09-18 ENCOUNTER — Encounter: Payer: Self-pay | Admitting: Physical Therapy

## 2024-09-18 DIAGNOSIS — M542 Cervicalgia: Secondary | ICD-10-CM

## 2024-09-18 NOTE — Therapy (Signed)
 OUTPATIENT PHYSICAL THERAPY SHOULDER TREATMENT   Patient Name: Kathleen Cain MRN: [id number] DOB:05/16/1991, 33 y.o., female Today's Date: 09/18/2024   PT End of Session - 09/18/24 1150     Visit Number 5    Number of Visits --   1-2x/week   Date for Recertification  10/22/24    Authorization Type [employer]    Authorization Time Period 08/27/24-10/26/24    Authorization - Visit Number 5    Authorization - Number of Visits 10    PT Start Time 1150    PT Stop Time 1230    PT Time Calculation (min) 40 min          Past Medical History:  Diagnosis Date   History of pre-eclampsia in prior pregnancy, currently pregnant    Past Surgical History:  Procedure Laterality Date   NO PAST SURGERIES     Patient Active Problem List   Diagnosis Date Noted   Vaginal delivery 10/19/2022   Abnormal fetal ultrasound 10/18/2022   Large for gestational age fetus affecting management of mother 10/17/2022    PCP: Patient, No Pcp Per  REFERRING PROVIDER: Dozier Soulier, MD  THERAPY DIAG:  Cervicalgia  REFERRING DIAG: Neck pain  Rationale for Evaluation and Treatment:  Rehabilitation  SUBJECTIVE:  PERTINENT PAST HISTORY:  None        PRECAUTIONS: None  WEIGHT BEARING RESTRICTIONS No  FALLS:  Has patient fallen in last 6 months? No, Number of falls: 0  MOI/History of condition:  Onset date: 1-2 months  SUBJECTIVE STATEMENT 09/11/24: Pt reports continued improvement.   Pt is a 33 y.o. female who presents to clinic with chief complaint of R sided periscapular and neck pain with some numbness in thumb and R index finger.  The pain was very high at first but has improved somewhat but the n/t in fingers remains.  Denies L UE sxs.  N/t in fingers is constant.  Started suddenly with no clear cause.  No trauma or acute injury.  Denies gross weakness in R UE   Red flags:  denies   Pain:  Are you having pain? Yes Pain location: periscapular region R UE, R  periscapular, R neck NPRS scale:  Best: 6/10, Worst: 10/10 Aggravating factors: no clear agging factors Relieving factors: no clear easing factors Pain description: burning  Occupation: NA  Assistive Device: NA  Hand Dominance: R handed  Patient Goals/Specific Activities: reduce pain, be able to lfit daughter   OBJECTIVE:   DIAGNOSTIC FINDINGS:  None available in epic  GENERAL OBSERVATION: Forward head     SENSATION: Light touch: Deficits thumb and index finger R hand   PALPATION: TTP lower cervical spine C5 - C7  Cervical ROM  ROM ROM  (Eval)  Flexion 50*  Extension 50  Right lateral flexion 45*  Left lateral flexion 40  Right rotation 80  Left rotation 80*  Flexion rotation (normal is 30 degrees)   Flexion rotation (normal is 30 degrees)     (Blank rows = not tested, N = WNL, * = concordant pain)  UPPER EXTREMITY MMT:  MMT Right (Eval) Left (Eval)  Shoulder flexion    Shoulder abduction (C5) c c  Shoulder ER    Shoulder IR    Middle trapezius    Lower trapezius    Shoulder extension    Grip strength    Shoulder shrug (C4) c c  Elbow flexion (C6) c c  Elbow ext (C7) c c  Thumb ext (C8) c c  Finger abd (T1) c c  Grossly     (Blank rows = not tested, score listed is out of 5 possible points.  N = WNL, D = diminished, C = clear for gross weakness with myotome testing, * = concordant pain with testing)   SPECIAL TESTS:  Spurling's: (+), ULTTA (+)  JOINT MOBILITY TESTING:  Reproduction of sxs with PA lower cervical spine  PATIENT SURVEYS:  NDI: 24/50    TODAY'S TREATMENT:  [facility] Adult PT Treatment:                                                DATE: 09/18/24 Therapeutic Exercise: supine chin tuck - 2x10 - 5'' hold W/ lift - 2x5- 5'' Standing row - 10# - 2x10 Standing shoulder ext - 10# - 2x10 Supine green band horiz abdct 15 x 2 - with chin tuck Chin tuck with UE flex/ext alternating - w/ gTB Dead Bug with ball 2x10 ea  Bird dog -  2x10 - slight chin tuck - cues for neutral position   Consider OH press next visit  Manual Therapy STW and TPR to right upper trap/levator Lateral glides lower cervical spine Chin tuck with OP L/R/C     [facility] Adult PT Treatment:                                                DATE: 09/07/24 Therapeutic Exercise: Seated chin tuck x 10 GTB Row 10 x 2 cues for neutral spine Red band pull down 10 x 2 - cues for neutral spine Seated levator stretch  Wall angels x 10  Back on wall- PPT  Updated HEP Manual Therapy: Manual cervical traction Passive UT stretch with over pressure to right shoulder-increased pain reduced with TPR TPR right upper trap     EVAL:  Therapeutic Exercise: Creating, reviewing, and completing below HEP    PATIENT EDUCATION ([place]/HM):  POC, diagnosis, prognosis, HEP, and outcome measures.  Pt educated via explanation, demonstration, and handout (HEP).  Pt confirms understanding verbally.    HOME EXERCISE PROGRAM: Access Code: [facility] URL: [email or web address] Date: 08/27/2024 Prepared by: Helene Gasmen  Exercises - Seated Cervical Retraction  - 3 x daily - 7 x weekly - 1 sets - 10 reps - 5'' hold Added 09/07/24 DISCONTINUE- Standing Row with Anchored Resistance  - 1 x daily - 7 x weekly - 2-3 sets - 10 reps - Seated Levator Scapulae Stretch  - 1 x daily - 7 x weekly - 1 sets - 2-3 reps - 20-30 hold Added 09/11/24: - Supine Shoulder Horizontal Abduction with Resistance  - 1 x daily - 7 x weekly - 2 sets - 10 reps - Alternating diagonals -( lay down to perform)   - 1 x daily - 7 x weekly - 2 sets - 10 reps   Treatment priorities   Eval        Traction?        No clear directional preference or relieving movements on exam                                  ASSESSMENT:  CLINICAL IMPRESSION: 09/18/2024: Pt  reports continued improvement in neck pain and n/t in R hand.  Adding volume and intensity to therex with no increase in UE or cx  sxs.  Will consider OH press progression.  No sxs in R index finger today and improving in R thumb.  HEP kept the same.   09/07/24: Pt reports compliance with HEP and improvement in pain. When she feels neck and arm pain she performs chin tucks which reduces her pain within minutes. Still having numbess in 1-2 digits. Reviewed HEP and progressed with postural strengthening and levator stretches. Manual performed to decreased right upper trap/levator tension. She required cues for thoracolumbar alignment with standing therex, tend to stand in sway back. Used wall to engage abdominals and reduce lordosis.    EVAL: Kathleen Cain is a 33 y.o. female who presents to clinic with signs and sxs consistent with R sided cervical radiculopathy originating from lower cervical spine (C6).   Kathleen Cain will benefit from skilled PT to address relevant deficits and improve comfort with daily tasks such as child care, sleep, and housework.   OBJECTIVE IMPAIRMENTS: Pain, cervical ROM  ACTIVITY LIMITATIONS: lifting, reaching, childcare, housework  PERSONAL FACTORS: See medical history and pertinent history   REHAB POTENTIAL: Good  CLINICAL DECISION MAKING: Evolving/moderate complexity  EVALUATION COMPLEXITY: Moderate   GOALS:   SHORT TERM GOALS: Target date: 09/24/2024  Kathleen Cain will be >75% HEP compliant to improve carryover between sessions and facilitate independent management of condition  Evaluation: ongoing Goal status: INITIAL   LONG TERM GOALS: Target date: 10/22/2024   Kathleen Cain will self report >/= 50% decrease in pain from evaluation to improve function in daily tasks  Evaluation/Baseline: 10/10 max pain Goal status: INITIAL   2.  Kathleen Cain will show a >/= 10 pt improvement in their NDI score as a proxy for functional improvement  Evaluation/Baseline: 24/50 pts Goal status: INITIAL   3.  Kathleen Cain will be able to Lift 33 year old with R arm, not limited by pain  Evaluation/Baseline:  limited Goal status: INITIAL   4.  Kathleen Cain will report confidence in self management of condition at time of discharge with advanced HEP  Evaluation/Baseline: unable to self manage Goal status: INITIAL    PLAN: PT FREQUENCY: 1-2x/week  PT DURATION: 8 weeks  PLANNED INTERVENTIONS:  97164- PT Re-evaluation, 97110-Therapeutic exercises, 97530- Therapeutic activity, 97112- Neuromuscular re-education, 97535- Self Care, 02859- Manual therapy, [id number]- Gait training, [id number]- Aquatic Therapy, [id number]- Electrical stimulation (manual), [id number]- Vasopneumatic device, [id number]- Traction (mechanical), [id number]- Ionotophoresis 4mg /ml Dexamethasone , Taping, Dry Needling, Joint manipulation, and Spinal manipulation.   Kathleen Cain, PTA 09/18/24 12:31 PM Phone: [phone] Fax: [phone]   I just finished a MCD eval/recert.  Name: Kathleen Cain  MRN: [id number] Please request 2x/week for 8 weeks.  Check all conditions that are expected to impact treatment: Musculoskeletal disorders   I DID put a charge in.  Check all possible CPT codes: 02889- Therapeutic Exercise, [phone]- Neuro Re-education, [phone] - Gait Training, [phone] - Manual Therapy, 97530 - Therapeutic Activities, 97535 - Self Care, [phone] - Re-evaluation, [id number] - Mechanical traction, and [id number] - Aquatic therapy   Thank you!  MCD - Secure

## 2024-09-20 ENCOUNTER — Encounter: Payer: Self-pay | Admitting: Physical Therapy

## 2024-09-20 ENCOUNTER — Ambulatory Visit: Attending: Orthopedic Surgery | Admitting: Physical Therapy

## 2024-09-20 DIAGNOSIS — M542 Cervicalgia: Secondary | ICD-10-CM | POA: Insufficient documentation

## 2024-09-20 NOTE — Therapy (Signed)
 OUTPATIENT PHYSICAL THERAPY SHOULDER TREATMENT   Patient Name: Kathleen Cain MRN: [id number] DOB:March 09, 1991, 33 y.o., female Today's Date: 09/20/2024   PT End of Session - 09/20/24 1153     Visit Number 6    Number of Visits --   1-2x/week   Date for Recertification  10/22/24    Authorization Type [employer]    Authorization Time Period 08/27/24-10/26/24    Authorization - Visit Number 6    Authorization - Number of Visits 10    PT Start Time 1149    PT Stop Time 1230    PT Time Calculation (min) 41 min          Past Medical History:  Diagnosis Date   History of pre-eclampsia in prior pregnancy, currently pregnant    Past Surgical History:  Procedure Laterality Date   NO PAST SURGERIES     Patient Active Problem List   Diagnosis Date Noted   Vaginal delivery 10/19/2022   Abnormal fetal ultrasound 10/18/2022   Large for gestational age fetus affecting management of mother 10/17/2022    PCP: Patient, No Pcp Per  REFERRING PROVIDER: Dozier Soulier, MD  THERAPY DIAG:  Cervicalgia  REFERRING DIAG: Neck pain  Rationale for Evaluation and Treatment:  Rehabilitation  SUBJECTIVE:  PERTINENT PAST HISTORY:  None        PRECAUTIONS: None  WEIGHT BEARING RESTRICTIONS No  FALLS:  Has patient fallen in last 6 months? No, Number of falls: 0  MOI/History of condition:  Onset date: 1-2 months  SUBJECTIVE STATEMENT 09/11/24: Pt reports continued improvement; no n/t in hand now.   Pt is a 33 y.o. female who presents to clinic with chief complaint of R sided periscapular and neck pain with some numbness in thumb and R index finger.  The pain was very high at first but has improved somewhat but the n/t in fingers remains.  Denies L UE sxs.  N/t in fingers is constant.  Started suddenly with no clear cause.  No trauma or acute injury.  Denies gross weakness in R UE   Red flags:  denies   Pain:  Are you having pain? Yes Pain location: periscapular  region R UE, R periscapular, R neck NPRS scale:  Best: 6/10, Worst: 10/10 Aggravating factors: no clear agging factors Relieving factors: no clear easing factors Pain description: burning  Occupation: NA  Assistive Device: NA  Hand Dominance: R handed  Patient Goals/Specific Activities: reduce pain, be able to lfit daughter   OBJECTIVE:   DIAGNOSTIC FINDINGS:  None available in epic  GENERAL OBSERVATION: Forward head     SENSATION: Light touch: Deficits thumb and index finger R hand   PALPATION: TTP lower cervical spine C5 - C7  Cervical ROM  ROM ROM  (Eval)  Flexion 50*  Extension 50  Right lateral flexion 45*  Left lateral flexion 40  Right rotation 80  Left rotation 80*  Flexion rotation (normal is 30 degrees)   Flexion rotation (normal is 30 degrees)     (Blank rows = not tested, N = WNL, * = concordant pain)  UPPER EXTREMITY MMT:  MMT Right (Eval) Left (Eval)  Shoulder flexion    Shoulder abduction (C5) c c  Shoulder ER    Shoulder IR    Middle trapezius    Lower trapezius    Shoulder extension    Grip strength    Shoulder shrug (C4) c c  Elbow flexion (C6) c c  Elbow ext (C7) c c  Thumb ext (C8) c c  Finger abd (T1) c c  Grossly     (Blank rows = not tested, score listed is out of 5 possible points.  N = WNL, D = diminished, C = clear for gross weakness with myotome testing, * = concordant pain with testing)   SPECIAL TESTS:  Spurling's: (+), ULTTA (+)  JOINT MOBILITY TESTING:  Reproduction of sxs with PA lower cervical spine  PATIENT SURVEYS:  NDI: 24/50    TODAY'S TREATMENT:  [facility] Adult PT Treatment:                                                DATE: 09/20/24 Therapeutic Exercise: supine chin tuck - 2x10 - 5'' hold W/ lift - 2x5- 8'' Seated row - 20# - 2x10 High - 15# - 2x10 Standing green band horiz abdct at wall 15 x 2 - with chin tuck Chin tuck with UE flex/ext alternating - w/ GTB Scaption at wall with CT -  2x10  Consider OH press next visit  Manual Therapy STW and TPR to right upper trap/levator Lateral glides lower cervical spine Chin tuck with OP L/R/C     [facility] Adult PT Treatment:                                                DATE: 09/07/24 Therapeutic Exercise: Seated chin tuck x 10 GTB Row 10 x 2 cues for neutral spine Red band pull down 10 x 2 - cues for neutral spine Seated levator stretch  Wall angels x 10  Back on wall- PPT  Updated HEP Manual Therapy: Manual cervical traction Passive UT stretch with over pressure to right shoulder-increased pain reduced with TPR TPR right upper trap     EVAL:  Therapeutic Exercise: Creating, reviewing, and completing below HEP    PATIENT EDUCATION (/HM):  POC, diagnosis, prognosis, HEP, and outcome measures.  Pt educated via explanation, demonstration, and handout (HEP).  Pt confirms understanding verbally.    HOME EXERCISE PROGRAM: Access Code: [facility] URL: [email or web address] Date: 08/27/2024 Prepared by: Helene Gasmen  Exercises - Seated Cervical Retraction  - 3 x daily - 7 x weekly - 1 sets - 10 reps - 5'' hold Added 09/07/24 DISCONTINUE- Standing Row with Anchored Resistance  - 1 x daily - 7 x weekly - 2-3 sets - 10 reps - Seated Levator Scapulae Stretch  - 1 x daily - 7 x weekly - 1 sets - 2-3 reps - 20-30 hold Added 09/11/24: - Supine Shoulder Horizontal Abduction with Resistance  - 1 x daily - 7 x weekly - 2 sets - 10 reps - Alternating diagonals -( lay down to perform)   - 1 x daily - 7 x weekly - 2 sets - 10 reps   Treatment priorities   Eval        Traction?        No clear directional preference or relieving movements on exam                                  ASSESSMENT:  CLINICAL IMPRESSION: 09/20/2024: Pt reports continued improvement in neck pain and n/t  in R hand.  No n/t during session today.  Working on adding some OH shoulder strengthening and performing past exercises in  standing.  Plan on D/C after currently scheduled visits given no flares.   09/07/24: Pt reports compliance with HEP and improvement in pain. When she feels neck and arm pain she performs chin tucks which reduces her pain within minutes. Still having numbess in 1-2 digits. Reviewed HEP and progressed with postural strengthening and levator stretches. Manual performed to decreased right upper trap/levator tension. She required cues for thoracolumbar alignment with standing therex, tend to stand in sway back. Used wall to engage abdominals and reduce lordosis.    EVAL: Trudee is a 33 y.o. female who presents to clinic with signs and sxs consistent with R sided cervical radiculopathy originating from lower cervical spine (C6).   Aylani will benefit from skilled PT to address relevant deficits and improve comfort with daily tasks such as child care, sleep, and housework.   OBJECTIVE IMPAIRMENTS: Pain, cervical ROM  ACTIVITY LIMITATIONS: lifting, reaching, childcare, housework  PERSONAL FACTORS: See medical history and pertinent history   REHAB POTENTIAL: Good  CLINICAL DECISION MAKING: Evolving/moderate complexity  EVALUATION COMPLEXITY: Moderate   GOALS:   SHORT TERM GOALS: Target date: 09/24/2024  Hetvi will be >75% HEP compliant to improve carryover between sessions and facilitate independent management of condition  Evaluation: ongoing Goal status: INITIAL   LONG TERM GOALS: Target date: 10/22/2024   Breniya will self report >/= 50% decrease in pain from evaluation to improve function in daily tasks  Evaluation/Baseline: 10/10 max pain Goal status: INITIAL   2.  Mahiya will show a >/= 10 pt improvement in their NDI score as a proxy for functional improvement  Evaluation/Baseline: 24/50 pts Goal status: INITIAL   3.  Carianna will be able to Lift 33 year old with R arm, not limited by pain  Evaluation/Baseline: limited Goal status: INITIAL   4.   Agusta will report confidence in self management of condition at time of discharge with advanced HEP  Evaluation/Baseline: unable to self manage Goal status: INITIAL    PLAN: PT FREQUENCY: 1-2x/week  PT DURATION: 8 weeks  PLANNED INTERVENTIONS:  97164- PT Re-evaluation, 97110-Therapeutic exercises, 97530- Therapeutic activity, 97112- Neuromuscular re-education, 97535- Self Care, 02859- Manual therapy, [id number]- Gait training, [id number]- Aquatic Therapy, [id number]- Electrical stimulation (manual), [id number]- Vasopneumatic device, [id number]- Traction (mechanical), [id number]- Ionotophoresis 4mg /ml Dexamethasone , Taping, Dry Needling, Joint manipulation, and Spinal manipulation.   Harlene Persons, PTA 09/20/24 12:31 PM Phone: [phone] Fax: [phone]   I just finished a MCD eval/recert.  Name: Carlyann Placide  MRN: [id number] Please request 2x/week for 8 weeks.  Check all conditions that are expected to impact treatment: Musculoskeletal disorders   I DID put a charge in.  Check all possible CPT codes: 02889- Therapeutic Exercise, [phone]- Neuro Re-education, [phone] - Gait Training, [phone] - Manual Therapy, 97530 - Therapeutic Activities, 97535 - Self Care, [phone] - Re-evaluation, [id number] - Mechanical traction, and [id number] - Aquatic therapy   Thank you!  MCD - Secure

## 2024-09-25 ENCOUNTER — Ambulatory Visit: Admitting: Physical Therapy

## 2024-09-25 ENCOUNTER — Encounter: Payer: Self-pay | Admitting: Physical Therapy

## 2024-09-25 DIAGNOSIS — M542 Cervicalgia: Secondary | ICD-10-CM | POA: Diagnosis not present

## 2024-09-25 NOTE — Therapy (Signed)
 OUTPATIENT PHYSICAL THERAPY SHOULDER TREATMENT   Patient Name: Kathleen Cain MRN: [id number] DOB:Dec 23, 1990, 33 y.o., female Today's Date: 09/25/2024   PT End of Session - 09/25/24 1148     Visit Number 7    Number of Visits --   1-2x/week   Date for Recertification  10/22/24    Authorization Type [employer]    Authorization Time Period 08/27/24-10/26/24    Authorization - Visit Number 7    Authorization - Number of Visits 10    PT Start Time 1148    PT Stop Time 1228    PT Time Calculation (min) 40 min          Past Medical History:  Diagnosis Date   History of pre-eclampsia in prior pregnancy, currently pregnant    Past Surgical History:  Procedure Laterality Date   NO PAST SURGERIES     Patient Active Problem List   Diagnosis Date Noted   Vaginal delivery 10/19/2022   Abnormal fetal ultrasound 10/18/2022   Large for gestational age fetus affecting management of mother 10/17/2022    PCP: Patient, No Pcp Per  REFERRING PROVIDER: Dozier Soulier, MD  THERAPY DIAG:  Cervicalgia  REFERRING DIAG: Neck pain  Rationale for Evaluation and Treatment:  Rehabilitation  SUBJECTIVE:  PERTINENT PAST HISTORY:  None        PRECAUTIONS: None  WEIGHT BEARING RESTRICTIONS No  FALLS:  Has patient fallen in last 6 months? No, Number of falls: 0  MOI/History of condition:  Onset date: 1-2 months  SUBJECTIVE STATEMENT 09/25/24: Pt reports that she has been doing well overall.  Had some pain a few years ago but used exercises to work it out.   Red flags:  denies   Pain:  Are you having pain? Yes Pain location: periscapular region R UE, R periscapular, R neck NPRS scale:  Best: 6/10, Worst: 10/10 Aggravating factors: no clear agging factors Relieving factors: no clear easing factors Pain description: burning  Occupation: NA  Assistive Device: NA  Hand Dominance: R handed  Patient Goals/Specific Activities: reduce pain, be able to lfit  daughter   OBJECTIVE:   DIAGNOSTIC FINDINGS:  None available in epic  GENERAL OBSERVATION: Forward head     SENSATION: Light touch: Deficits thumb and index finger R hand   PALPATION: TTP lower cervical spine C5 - C7  Cervical ROM  ROM ROM  (Eval)  Flexion 50*  Extension 50  Right lateral flexion 45*  Left lateral flexion 40  Right rotation 80  Left rotation 80*  Flexion rotation (normal is 30 degrees)   Flexion rotation (normal is 30 degrees)     (Blank rows = not tested, N = WNL, * = concordant pain)  UPPER EXTREMITY MMT:  MMT Right (Eval) Left (Eval)  Shoulder flexion    Shoulder abduction (C5) c c  Shoulder ER    Shoulder IR    Middle trapezius    Lower trapezius    Shoulder extension    Grip strength    Shoulder shrug (C4) c c  Elbow flexion (C6) c c  Elbow ext (C7) c c  Thumb ext (C8) c c  Finger abd (T1) c c  Grossly     (Blank rows = not tested, score listed is out of 5 possible points.  N = WNL, D = diminished, C = clear for gross weakness with myotome testing, * = concordant pain with testing)   SPECIAL TESTS:  Spurling's: (+), ULTTA (+)  JOINT MOBILITY TESTING:  Reproduction of sxs with PA lower cervical spine  PATIENT SURVEYS:  NDI: 24/50    TODAY'S TREATMENT:  [facility] Adult PT Treatment:                                                DATE: 09/20/24 Therapeutic Exercise: supine chin tuck - 2x10 - 5'' hold W/ lift - 2x5- 10'' Seated row - 20# - 2x10 High - 15# - 2x10 Standing green band cheerleaders at wall 5 x 2 - with chin tuck Scaption at wall with CT - 2x10 - 1# OH press - 4# - 90-90 - 2x10 Prone T on pball - 2x10  Manual Therapy STW and TPR to right upper trap/levator Lateral glides lower cervical spine Chin tuck with OP L/R/C     [facility] Adult PT Treatment:                                                DATE: 09/07/24 Therapeutic Exercise: Seated chin tuck x 10 GTB Row 10 x 2 cues for neutral spine Red band pull down 10  x 2 - cues for neutral spine Seated levator stretch  Wall angels x 10  Back on wall- PPT  Updated HEP Manual Therapy: Manual cervical traction Passive UT stretch with over pressure to right shoulder-increased pain reduced with TPR TPR right upper trap     EVAL:  Therapeutic Exercise: Creating, reviewing, and completing below HEP    PATIENT EDUCATION ([place]/HM):  POC, diagnosis, prognosis, HEP, and outcome measures.  Pt educated via explanation, demonstration, and handout (HEP).  Pt confirms understanding verbally.    HOME EXERCISE PROGRAM: Access Code: [facility] URL: [email or web address] Date: 08/27/2024 Prepared by: Helene Gasmen  Exercises - Seated Cervical Retraction  - 3 x daily - 7 x weekly - 1 sets - 10 reps - 5'' hold Added 09/07/24 DISCONTINUE- Standing Row with Anchored Resistance  - 1 x daily - 7 x weekly - 2-3 sets - 10 reps - Seated Levator Scapulae Stretch  - 1 x daily - 7 x weekly - 1 sets - 2-3 reps - 20-30 hold Added 09/11/24: - Supine Shoulder Horizontal Abduction with Resistance  - 1 x daily - 7 x weekly - 2 sets - 10 reps - Alternating diagonals -( lay down to perform)   - 1 x daily - 7 x weekly - 2 sets - 10 reps   Treatment priorities   Eval        Traction?        No clear directional preference or relieving movements on exam                                  ASSESSMENT:  CLINICAL IMPRESSION: 09/25/2024: Continued good response with no more complaint of n/t in digits of R hand.  One instance of pain since last visit but improved with HEP.  Plan on D/C next visit with updated HEP.   09/07/24: Pt reports compliance with HEP and improvement in pain. When she feels neck and arm pain she performs chin tucks which reduces her pain within minutes. Still having numbess in 1-2 digits. Reviewed HEP and progressed with  postural strengthening and levator stretches. Manual performed to decreased right upper trap/levator tension. She required cues  for thoracolumbar alignment with standing therex, tend to stand in sway back. Used wall to engage abdominals and reduce lordosis.    EVAL: [name] is a 33 y.o. female who presents to clinic with signs and sxs consistent with R sided cervical radiculopathy originating from lower cervical spine (C6).   Shonte will benefit from skilled PT to address relevant deficits and improve comfort with daily tasks such as child care, sleep, and housework.   OBJECTIVE IMPAIRMENTS: Pain, cervical ROM  ACTIVITY LIMITATIONS: lifting, reaching, childcare, housework  PERSONAL FACTORS: See medical history and pertinent history   REHAB POTENTIAL: Good  CLINICAL DECISION MAKING: Evolving/moderate complexity  EVALUATION COMPLEXITY: Moderate   GOALS:   SHORT TERM GOALS: Target date: 09/24/2024  Hlee will be >75% HEP compliant to improve carryover between sessions and facilitate independent management of condition  Evaluation: ongoing Goal status: INITIAL   LONG TERM GOALS: Target date: 10/22/2024   Aliany will self report >/= 50% decrease in pain from evaluation to improve function in daily tasks  Evaluation/Baseline: 10/10 max pain Goal status: INITIAL   2.  Jerald will show a >/= 10 pt improvement in their NDI score as a proxy for functional improvement  Evaluation/Baseline: 24/50 pts Goal status: INITIAL   3.  Rachelanne will be able to Lift 33 year old with R arm, not limited by pain  Evaluation/Baseline: limited Goal status: INITIAL   4.  Marti will report confidence in self management of condition at time of discharge with advanced HEP  Evaluation/Baseline: unable to self manage Goal status: INITIAL    PLAN: PT FREQUENCY: 1-2x/week  PT DURATION: 8 weeks  PLANNED INTERVENTIONS:  97164- PT Re-evaluation, 97110-Therapeutic exercises, 97530- Therapeutic activity, 97112- Neuromuscular re-education, 97535- Self Care, 02859- Manual therapy, [id number]- Gait training,  [id number]- Aquatic Therapy, [id number]- Electrical stimulation (manual), [id number]- Vasopneumatic device, M403810- Traction (mechanical), [id number]- Ionotophoresis 4mg /ml Dexamethasone , Taping, Dry Needling, Joint manipulation, and Spinal manipulation.   Harlene Persons, PTA 09/25/24 12:35 PM Phone: [phone] Fax: [phone]   I just finished a MCD eval/recert.  Name: Myca Perno  MRN: [id number] Please request 2x/week for 8 weeks.  Check all conditions that are expected to impact treatment: Musculoskeletal disorders   I DID put a charge in.  Check all possible CPT codes: 02889- Therapeutic Exercise, [phone]- Neuro Re-education, [phone] - Gait Training, [phone] - Manual Therapy, 97530 - Therapeutic Activities, 97535 - Self Care, [phone] - Re-evaluation, M403810 - Mechanical traction, and [id number] - Aquatic therapy   Thank you!  MCD - Secure

## 2024-09-27 ENCOUNTER — Encounter: Payer: Self-pay | Admitting: Physical Therapy

## 2024-09-27 ENCOUNTER — Ambulatory Visit: Admitting: Physical Therapy

## 2024-09-27 DIAGNOSIS — M542 Cervicalgia: Secondary | ICD-10-CM | POA: Diagnosis not present

## 2024-09-27 NOTE — Therapy (Signed)
 PHYSICAL THERAPY DISCHARGE SUMMARY  Visits from Start of Care: 8  Current functional level related to goals / functional outcomes: See assessment/goals   Remaining deficits: See assessment/goals   Education / Equipment: HEP and D/C plans  Patient agrees to discharge. Patient goals were met. Patient is being discharged due to meeting the stated rehab goals.   Patient Name: Kathleen Cain MRN: [id number] DOB:04/11/1991, 33 y.o., female Today's Date: 09/27/2024   PT End of Session - 09/27/24 1148     Visit Number 8    Number of Visits --   1-2x/week   Date for Recertification  10/22/24    Authorization Type [employer] [facility]    Authorization Time Period 08/27/24-10/26/24    Authorization - Visit Number 8    Authorization - Number of Visits 10    PT Start Time 1145    PT Stop Time 1210    PT Time Calculation (min) 25 min           Past Medical History:  Diagnosis Date   History of pre-eclampsia in prior pregnancy, currently pregnant    Past Surgical History:  Procedure Laterality Date   NO PAST SURGERIES     Patient Active Problem List   Diagnosis Date Noted   Vaginal delivery 10/19/2022   Abnormal fetal ultrasound 10/18/2022   Large for gestational age fetus affecting management of mother 10/17/2022    PCP: Patient, No Pcp Per  REFERRING PROVIDER: Dozier Soulier, MD  THERAPY DIAG:  Cervicalgia  REFERRING DIAG: Neck pain  Rationale for Evaluation and Treatment:  Rehabilitation  SUBJECTIVE:  PERTINENT PAST HISTORY:  None        PRECAUTIONS: None  WEIGHT BEARING RESTRICTIONS No  FALLS:  Has patient fallen in last 6 months? No, Number of falls: 0  MOI/History of condition:  Onset date: 1-2 months  SUBJECTIVE STATEMENT 10/9: pt reports that she is doing well and feels prepared for D/C.   Red flags:  denies   Pain:  Are you having pain? Yes Pain location: periscapular region R UE, R periscapular, R neck NPRS scale:  Best: 6/10,  Worst: 10/10 Aggravating factors: no clear agging factors Relieving factors: no clear easing factors Pain description: burning  Occupation: NA  Assistive Device: NA  Hand Dominance: R handed  Patient Goals/Specific Activities: reduce pain, be able to lfit daughter   OBJECTIVE:   DIAGNOSTIC FINDINGS:  None available in epic  GENERAL OBSERVATION: Forward head     SENSATION: Light touch: Deficits thumb and index finger R hand   PALPATION: TTP lower cervical spine C5 - C7  Cervical ROM  ROM ROM  (Eval)  Flexion 50*  Extension 50  Right lateral flexion 45*  Left lateral flexion 40  Right rotation 80  Left rotation 80*  Flexion rotation (normal is 30 degrees)   Flexion rotation (normal is 30 degrees)     (Blank rows = not tested, N = WNL, * = concordant pain)  UPPER EXTREMITY MMT:  MMT Right (Eval) Left (Eval)  Shoulder flexion    Shoulder abduction (C5) c c  Shoulder ER    Shoulder IR    Middle trapezius    Lower trapezius    Shoulder extension    Grip strength    Shoulder shrug (C4) c c  Elbow flexion (C6) c c  Elbow ext (C7) c c  Thumb ext (C8) c c  Finger abd (T1) c c  Grossly     (Blank rows = not tested, score  listed is out of 5 possible points.  N = WNL, D = diminished, C = clear for gross weakness with myotome testing, * = concordant pain with testing)   SPECIAL TESTS:  Spurling's: (+), ULTTA (+)  JOINT MOBILITY TESTING:  Reproduction of sxs with PA lower cervical spine  PATIENT SURVEYS:  NDI: 24/50    TODAY'S TREATMENT:  [facility] Adult PT Treatment:                                                DATE: 09/27/24 Therapeutic Exercise: supine chin tuck - 2x10 - 5'' hold W/ lift - 2x5- 10'' Standing green band cheerleaders at wall 5 x 2 - with chin tuck Sc[place] at wall with CT - 2x10 - YTB  Therapeutic Activity  collecting information for goals, checking progress, and reviewing with patient     EVAL:  Therapeutic Exercise:  Creating, reviewing, and completing below HEP    PATIENT EDUCATION ([place]/[facility]):  POC, diagnosis, prognosis, HEP, and outcome measures.  Pt educated via explanation, demonstration, and handout (HEP).  Pt confirms understanding verbally.    HOME EXERCISE PROGRAM: Access Code: [facility] URL: [email or web address] Date: 08/27/2024 Prepared by: Kathleen Cain  Exercises - Seated Cervical Retraction  - 3 x daily - 7 x weekly - 1 sets - 10 reps - 5'' hold Added 09/07/24 DISCONTINUE- Standing Row with Anchored Resistance  - 1 x daily - 7 x weekly - 2-3 sets - 10 reps - Seated Levator Scapulae Stretch  - 1 x daily - 7 x weekly - 1 sets - 2-3 reps - 20-30 hold Added 09/11/24: - Supine Shoulder Horizontal Abduction with Resistance  - 1 x daily - 7 x weekly - 2 sets - 10 reps - Alternating diagonals -( lay down to perform)   - 1 x daily - 7 x weekly - 2 sets - 10 reps   Treatment priorities   Eval        Traction?        No clear directional preference or relieving movements on exam                                  ASSESSMENT:  CLINICAL IMPRESSION: 09/27/2024: Pt has met all long and short term goals and is confident in self management with HEP.   09/07/24: Pt reports compliance with HEP and improvement in pain. When she feels neck and arm pain she performs chin tucks which reduces her pain within minutes. Still having numbess in 1-2 digits. Reviewed HEP and progressed with postural strengthening and levator stretches. Manual performed to decreased right upper trap/levator tension. She required cues for thoracolumbar alignment with standing therex, tend to stand in sway back. Used wall to engage abdominals and reduce lordosis.    EVAL: Kathleen Cain is a 33 y.o. female who presents to clinic with signs and sxs consistent with R sided cervical radiculopathy originating from lower cervical spine (C6).   Kathleen Cain will benefit from skilled PT to address relevant deficits and improve  comfort with daily tasks such as child care, sleep, and housework.   OBJECTIVE IMPAIRMENTS: Pain, cervical ROM  ACTIVITY LIMITATIONS: lifting, reaching, childcare, housework  PERSONAL FACTORS: See medical history and pertinent history   REHAB POTENTIAL: Good  CLINICAL DECISION MAKING: Evolving/moderate complexity  EVALUATION COMPLEXITY:  Moderate   GOALS:   SHORT TERM GOALS: Target date: 09/24/2024  Kathleen Cain will be >75% HEP compliant to improve carryover between sessions and facilitate independent management of condition  Evaluation: ongoing Goal status: MET   LONG TERM GOALS: Target date: 10/22/2024   Kathleen Cain will self report >/= 50% decrease in pain from evaluation to improve function in daily tasks  Evaluation/Baseline: 10/10 max pain 10/9: 0-1/10 Goal status: MET   2.  Kathleen Cain will show a >/= 10 pt improvement in their NDI score as a proxy for functional improvement  Evaluation/Baseline: 24/50 pts 10/9: 0/50 pts Goal status: MET   3.  Kathleen Cain will be able to Lift 33 year old with R arm, not limited by pain  Evaluation/Baseline: limited 10/9: MET Goal status: MET   4.  Kathleen Cain will report confidence in self management of condition at time of discharge with advanced HEP  Evaluation/Baseline: unable to self manage 10/9: MET Goal status: MET    PLAN: PT FREQUENCY: 1-2x/week  PT DURATION: 8 weeks  PLANNED INTERVENTIONS:  97164- PT Re-evaluation, 97110-Therapeutic exercises, 97530- Therapeutic activity, 97112- Neuromuscular re-education, 97535- Self Care, 02859- Manual therapy, [id number]- Gait training, [id number]- Aquatic Therapy, [id number]- Electrical stimulation (manual), [id number]- Vasopneumatic device, [id number]- Traction (mechanical), [id number]- Ionotophoresis 4mg /ml Dexamethasone , Taping, Dry Needling, Joint manipulation, and Spinal manipulation.   Kathleen Cain PT 09/27/24 1:18 PM Phone: [phone] Fax: [phone]   I just finished a MCD  eval/recert.  Name: Kathleen Cain  MRN: [id number] Please request 2x/week for 8 weeks.  Check all conditions that are expected to impact treatment: Musculoskeletal disorders   I DID put a charge in.  Check all possible CPT codes: 02889- Therapeutic Exercise, [phone]- Neuro Re-education, [phone] - Gait Training, [phone] - Manual Therapy, 97530 - Therapeutic Activities, 97535 - Self Care, [id number] - Re-evaluation, [id number] - Mechanical traction, and [id number] - Aquatic therapy   Thank you!  MCD - Secure
